# Patient Record
Sex: Female | Born: 1980 | Race: Black or African American | Hispanic: No | Marital: Married | Smoking: Never smoker
Health system: Southern US, Community
[De-identification: ages and names within clinical notes are randomized; demographics above are authoritative.]

## PROBLEM LIST (undated history)

## (undated) DIAGNOSIS — T7840XA Allergy, unspecified, initial encounter: Secondary | ICD-10-CM

## (undated) DIAGNOSIS — F32A Depression, unspecified: Secondary | ICD-10-CM

## (undated) DIAGNOSIS — D649 Anemia, unspecified: Secondary | ICD-10-CM

## (undated) DIAGNOSIS — F419 Anxiety disorder, unspecified: Secondary | ICD-10-CM

## (undated) DIAGNOSIS — K2970 Gastritis, unspecified, without bleeding: Secondary | ICD-10-CM

## (undated) DIAGNOSIS — K219 Gastro-esophageal reflux disease without esophagitis: Secondary | ICD-10-CM

## (undated) DIAGNOSIS — R011 Cardiac murmur, unspecified: Secondary | ICD-10-CM

## (undated) DIAGNOSIS — M779 Enthesopathy, unspecified: Secondary | ICD-10-CM

## (undated) DIAGNOSIS — I471 Supraventricular tachycardia, unspecified: Secondary | ICD-10-CM

## (undated) DIAGNOSIS — K589 Irritable bowel syndrome without diarrhea: Secondary | ICD-10-CM

## (undated) DIAGNOSIS — F329 Major depressive disorder, single episode, unspecified: Secondary | ICD-10-CM

## (undated) HISTORY — PX: CHOLECYSTECTOMY: SHX55

## (undated) HISTORY — PX: OTHER SURGICAL HISTORY: SHX169

## (undated) HISTORY — DX: Cardiac murmur, unspecified: R01.1

## (undated) HISTORY — DX: Allergy, unspecified, initial encounter: T78.40XA

## (undated) HISTORY — PX: BREAST SURGERY: SHX581

## (undated) HISTORY — DX: Gastro-esophageal reflux disease without esophagitis: K21.9

## (undated) HISTORY — PX: TONSILLECTOMY: SUR1361

## (undated) HISTORY — DX: Anemia, unspecified: D64.9

## (undated) HISTORY — PX: TUBAL LIGATION: SHX77

---

## 2017-12-08 ENCOUNTER — Emergency Department
Admission: EM | Admit: 2017-12-08 | Discharge: 2017-12-08 | Disposition: A | Discharge status: Home / Self Care | Payer: Medicaid Other | Attending: Emergency Medicine | Admitting: Emergency Medicine

## 2017-12-08 ENCOUNTER — Other Ambulatory Visit: Payer: Self-pay

## 2017-12-08 ENCOUNTER — Encounter: Payer: Self-pay | Admitting: Emergency Medicine

## 2017-12-08 DIAGNOSIS — R42 Dizziness and giddiness: Secondary | ICD-10-CM

## 2017-12-08 DIAGNOSIS — R112 Nausea with vomiting, unspecified: Secondary | ICD-10-CM

## 2017-12-08 DIAGNOSIS — E860 Dehydration: Secondary | ICD-10-CM

## 2017-12-08 DIAGNOSIS — N3000 Acute cystitis without hematuria: Secondary | ICD-10-CM | POA: Diagnosis not present

## 2017-12-08 HISTORY — DX: Supraventricular tachycardia: I47.1

## 2017-12-08 HISTORY — DX: Anxiety disorder, unspecified: F41.9

## 2017-12-08 HISTORY — DX: Supraventricular tachycardia, unspecified: I47.10

## 2017-12-08 HISTORY — DX: Major depressive disorder, single episode, unspecified: F32.9

## 2017-12-08 HISTORY — DX: Depression, unspecified: F32.A

## 2017-12-08 LAB — COMPREHENSIVE METABOLIC PANEL
ALT: 9 U/L — ABNORMAL LOW (ref 14–54)
ANION GAP: 7 (ref 5–15)
AST: 16 U/L (ref 15–41)
Albumin: 4.2 g/dL (ref 3.5–5.0)
Alkaline Phosphatase: 62 U/L (ref 38–126)
BUN: 10 mg/dL (ref 6–20)
CHLORIDE: 108 mmol/L (ref 101–111)
CO2: 22 mmol/L (ref 22–32)
Calcium: 8.9 mg/dL (ref 8.9–10.3)
Creatinine, Ser: 0.78 mg/dL (ref 0.44–1.00)
GFR calc non Af Amer: >60 mL/min (ref >60)
Glucose, Bld: 85 mg/dL (ref 65–99)
POTASSIUM: 3.6 mmol/L (ref 3.5–5.1)
Sodium: 137 mmol/L (ref 135–145)
TOTAL PROTEIN: 7.4 g/dL (ref 6.5–8.1)
Total Bilirubin: 0.7 mg/dL (ref 0.3–1.2)

## 2017-12-08 LAB — URINALYSIS, COMPLETE (UACMP) WITH MICROSCOPIC
BILIRUBIN URINE: NEGATIVE
Bacteria, UA: NONE SEEN
GLUCOSE, UA: NEGATIVE mg/dL
KETONES UR: 5 mg/dL — AB
NITRITE: NEGATIVE
PH: 8 (ref 5.0–8.0)
Protein, ur: NEGATIVE mg/dL
SPECIFIC GRAVITY, URINE: 1.011 (ref 1.005–1.030)

## 2017-12-08 LAB — CBC WITH DIFFERENTIAL/PLATELET
BASOS ABS: 0 10*3/uL (ref 0–0.1)
Basophils Relative: 0 %
EOS PCT: 0 %
Eosinophils Absolute: 0 10*3/uL (ref 0–0.7)
HCT: 37.9 % (ref 35.0–47.0)
Hemoglobin: 12.8 g/dL (ref 12.0–16.0)
LYMPHS PCT: 12 %
Lymphs Abs: 1.1 10*3/uL (ref 1.0–3.6)
MCH: 29.3 pg (ref 26.0–34.0)
MCHC: 33.8 g/dL (ref 32.0–36.0)
MCV: 86.7 fL (ref 80.0–100.0)
MONO ABS: 0.3 10*3/uL (ref 0.2–0.9)
MONOS PCT: 3 %
Neutro Abs: 8 10*3/uL — ABNORMAL HIGH (ref 1.4–6.5)
Neutrophils Relative %: 85 %
PLATELETS: 358 10*3/uL (ref 150–440)
RBC: 4.38 MIL/uL (ref 3.80–5.20)
RDW: 13.9 % (ref 11.5–14.5)
WBC: 9.4 10*3/uL (ref 3.6–11.0)

## 2017-12-08 LAB — LIPASE, BLOOD: LIPASE: 23 U/L (ref 11–51)

## 2017-12-08 LAB — HCG, QUANTITATIVE, PREGNANCY

## 2017-12-08 MED ORDER — SODIUM CHLORIDE 0.9 % IV BOLUS
1000.0000 mL | Freq: Once | INTRAVENOUS | Status: AC
  Administered 2017-12-08: 1000 mL via INTRAVENOUS

## 2017-12-08 MED ORDER — ONDANSETRON 4 MG PO TBDP: 4.0000 mg | ORAL_TABLET | Freq: Three times a day (TID) | ORAL | 0 refills | Status: DC | PRN

## 2017-12-08 MED ORDER — NITROFURANTOIN MONOHYD MACRO 100 MG PO CAPS
100.0000 mg | ORAL_CAPSULE | Freq: Once | ORAL | Status: AC
  Administered 2017-12-08: 100 mg via ORAL
  Filled 2017-12-08: qty 1

## 2017-12-08 MED ORDER — ONDANSETRON HCL 4 MG/2ML IJ SOLN
4.0000 mg | Freq: Once | INTRAMUSCULAR | Status: AC
  Administered 2017-12-08: 4 mg via INTRAVENOUS
  Filled 2017-12-08: qty 2

## 2017-12-08 MED ORDER — SODIUM CHLORIDE 0.9 % IV BOLUS
1000.0000 mL | Freq: Once | INTRAVENOUS | Status: AC
Start: 2017-12-08 — End: 2017-12-08
  Administered 2017-12-08: 1000 mL via INTRAVENOUS

## 2017-12-08 MED ORDER — MECLIZINE HCL 25 MG PO TABS
25.0000 mg | ORAL_TABLET | Freq: Once | ORAL | Status: AC
  Administered 2017-12-08: 25 mg via ORAL

## 2017-12-08 MED ORDER — MECLIZINE HCL 25 MG PO TABS
ORAL_TABLET | ORAL | Status: AC
  Administered 2017-12-08: 25 mg via ORAL
  Filled 2017-12-08: qty 1

## 2017-12-08 MED ORDER — NITROFURANTOIN MONOHYD MACRO 100 MG PO CAPS: 100.0000 mg | ORAL_CAPSULE | Freq: Two times a day (BID) | ORAL | 0 refills | Status: AC

## 2017-12-08 NOTE — ED Provider Notes (Signed)
[facility] Emergency Department Provider Note  ____________________________________________  Time seen: Approximately 11:57 AM  I have reviewed the triage vital signs and the nursing notes.   HISTORY  Chief Complaint Dizziness and Emesis   HPI Tammy Bennett is a 37 y.o. female a history of anxiety depression who presents for evaluation of dizziness, nausea and vomiting.  Patient reports for the last several weeks she has had severe nausea.  She has had decreased appetite because of the nausea.  She reports that she usually has a small episode of nonbloody nonbilious emesis in the morning and no further episodes during the day but the nausea persists and is usually severe.  This morning she had a small episode of emesis and after that started feeling dizzy like she was going to pass out.  Her hands started to feel tingly.  She reports that the dizziness is improved laying back well when she stands up he gets worse.  She denies diarrhea and has been having normal bowel movements, she denies abdominal pain or surgeries, she denies fever or chills, she denies dysuria or hematuria, she denies URI symptoms, chest pain or shortness of breath.  Patient reports that she has a tubal ligation and an IUD and therefore she does not believe she is pregnant.  She denies history of IBS or IBD or any similar symptoms in the past. No HA  Past Medical History:  Diagnosis Date  . Anxiety   . Depression   . SVT (supraventricular tachycardia) (HCC)     Past Surgical History:  Procedure Laterality Date  . CHOLECYSTECTOMY    . TUBAL LIGATION      Prior to Admission medications   Medication Sig Start Date End Date Taking? Authorizing Provider  nitrofurantoin, macrocrystal-monohydrate, (MACROBID) 100 MG capsule Take 1 capsule (100 mg total) by mouth 2 (two) times daily for 5 days. 12/08/17 12/13/17  Nita Sickle, MD  ondansetron (ZOFRAN ODT) 4 MG disintegrating tablet Take 1  tablet (4 mg total) by mouth every 8 (eight) hours as needed for nausea or vomiting. 12/08/17   Nita Sickle, MD    Allergies Patient has no known allergies.  FH [facility]  Social History Social History   Tobacco Use  . Smoking status: Never Smoker  . Smokeless tobacco: Never Used  Substance Use Topics  . Alcohol use: Not Currently  . Drug use: Not Currently    Review of Systems  Constitutional: Negative for fever. Eyes: Negative for visual changes. ENT: Negative for sore throat. Neck: No neck pain  Cardiovascular: Negative for chest pain. Respiratory: Negative for shortness of breath. Gastrointestinal: Negative for abdominal pain or diarrhea. + Nausea, vomiting Genitourinary: Negative for dysuria. Musculoskeletal: Negative for back pain. Skin: Negative for rash. Neurological: Negative for headaches, weakness or numbness. Psych: No SI or HI  ____________________________________________   PHYSICAL EXAM:  VITAL SIGNS: ED Triage Vitals  Enc Vitals Group     BP 12/08/17 0834 (!) 132/91     Pulse Rate 12/08/17 0834 80     Resp 12/08/17 0834 18     Temp 12/08/17 0834 98.1 F (36.7 C)     Temp Source 12/08/17 0834 Oral     SpO2 12/08/17 0834 100 %     Weight 12/08/17 0839 190 lb (86.2 kg)     Height 12/08/17 0839  (1.575 m)     Head Circumference --      Peak Flow --      Pain Score 12/08/17 0839  0     Pain Loc --      Pain Edu? --      Excl. in GC? --     Constitutional: Alert and oriented. Well appearing and in no apparent distress. HEENT:      Head: Normocephalic and atraumatic.         Eyes: Conjunctivae are normal. Sclera is non-icteric.       Mouth/Throat: Mucous membranes are moist.       Neck: Supple with no signs of meningismus. Cardiovascular: Regular rate and rhythm. No murmurs, gallops, or rubs. 2+ symmetrical distal pulses are present in all extremities. No JVD. Respiratory: Normal respiratory effort. Lungs are clear to auscultation  bilaterally. No wheezes, crackles, or rhonchi.  Gastrointestinal: Soft, non tender, and non distended with positive bowel sounds. No rebound or guarding. Musculoskeletal: Nontender with normal range of motion in all extremities. No edema, cyanosis, or erythema of extremities. Neurologic: Normal speech and language. Face is symmetric. Moving all extremities. No gross focal neurologic deficits are appreciated. Skin: Skin is warm, dry and intact. No rash noted. Psychiatric: Mood and affect are normal. Speech and behavior are normal.  ____________________________________________   LABS (all labs ordered are listed, but only abnormal results are displayed)  Labs Reviewed  CBC WITH DIFFERENTIAL/PLATELET - Abnormal; Notable for the following components:      Result Value   Neutro Abs 8.0 (*)    All other components within normal limits  COMPREHENSIVE METABOLIC PANEL - Abnormal; Notable for the following components:   ALT 9 (*)    All other components within normal limits  URINALYSIS, COMPLETE (UACMP) WITH MICROSCOPIC - Abnormal; Notable for the following components:   Color, Urine YELLOW (*)    APPearance HAZY (*)    Hgb urine dipstick LARGE (*)    Ketones, ur 5 (*)    Leukocytes, UA TRACE (*)    All other components within normal limits  URINE CULTURE  HCG, QUANTITATIVE, PREGNANCY  LIPASE, BLOOD   ____________________________________________  EKG  ED ECG REPORT I, Nita Sickle, the attending physician, personally viewed and interpreted this ECG.  Normal sinus rhythm, rate of 81, normal intervals, right axis deviation, no ST elevations or depressions. ____________________________________________  RADIOLOGY  none ____________________________________________   PROCEDURES  Procedure(s) performed: None Procedures Critical Care performed:  None ____________________________________________   INITIAL IMPRESSION / ASSESSMENT AND PLAN / ED COURSE   37 y.o. female a  history of anxiety depression who presents for evaluation of dizziness, nausea and vomiting x several weeks.  Patient is extremely well-appearing, no distress, has normal vital signs although she is orthostatic with heart rate going from 70s to 90s with standing.  Blood pressure remains stable.  Her abdomen is soft with no tenderness throughout, lungs are clear, physical exam is within normal limits.  Patient has no right upper quadrant tenderness on exam.  hCG is negative.  CBC, CMP and lipase with no acute findings.  Urinalysis is pending to rule out infection or ketones. Patient given zofran and IVF. Will reassess after that.    _________________________ 3:31 PM on 12/08/2017 -----------------------------------------  UA positive for UTI for which patient was started on Macrobid.  After first liter fluid patient still complained of feeling dizzy although improvement on her vital signs.  Patient is currently receiving a second liter of fluids.  Plan to discharge home on Macrobid and follow-up with GI.  Care transferred to Dr. Scotty Court.   As part of my medical decision making, I reviewed the  following data within the electronic MEDICAL RECORD NUMBER Nursing notes reviewed and incorporated, Labs reviewed , Notes from prior ED visits and  Controlled Substance Database    Pertinent labs & imaging results that were available during my care of the patient were reviewed by me and considered in my medical decision making (see chart for details).    ____________________________________________   FINAL CLINICAL IMPRESSION(S) / ED DIAGNOSES  Final diagnoses:  Dehydration  Dizziness  Nausea and vomiting, intractability of vomiting not specified, unspecified vomiting type  Acute cystitis without hematuria      NEW MEDICATIONS STARTED DURING THIS VISIT:  ED Discharge Orders        Ordered    ondansetron (ZOFRAN ODT) 4 MG disintegrating tablet  Every 8 hours PRN     12/08/17 1509     nitrofurantoin, macrocrystal-monohydrate, (MACROBID) 100 MG capsule  2 times daily     12/08/17 1530       Note:  This document was prepared using Dragon voice recognition software and may include unintentional dictation errors.    Nita Sickle, MD 12/08/17 [phone]

## 2017-12-08 NOTE — ED Notes (Addendum)
Pt given fluids and crackers for PO challenge.  While standing pt states she feels lightheaded, dizzy. While standing to taking ortho vitals pt reached out grabbing RNs arm to steady herself while keeping eyes closed

## 2017-12-08 NOTE — ED Triage Notes (Signed)
Pt to ED via POV c/o nausea for the past few weeks. Pt states that every morning when she wakes up she vomits. Pt also states that about 1 hour PTA she started to feel dizzy. Pt states that the dizziness started after she vomited. Pt states that the dizziness has been persistent since then. Pt states that she feels lightheaded and anxious. Pt is in NAD at this time.

## 2017-12-08 NOTE — ED Notes (Signed)
First Nurse Note:  Patient amb to registration desk, complaining of dizziness and tingling starting this AM.  States she has never been seen at this facility before.

## 2017-12-08 NOTE — ED Provider Notes (Signed)
after second liter of IV fluids, patient feels back to normal. She is tolerating oral intake, sitting upright, asymptomatic. Vital signs are normal. She is eager to go home. She is stable for discharge at this time. Macrobid and Zofran as prescribed by Dr. Manuella Ghazi.   Sharman Cheek, MD 12/08/17 1754

## 2017-12-08 NOTE — ED Notes (Addendum)
Pt up to toilet, pt appears to be steady on her feet and is able to ambulate without difficulties.

## 2017-12-09 LAB — URINE CULTURE

## 2018-01-07 ENCOUNTER — Emergency Department: Payer: Medicaid Other

## 2018-01-07 ENCOUNTER — Other Ambulatory Visit: Payer: Self-pay

## 2018-01-07 ENCOUNTER — Emergency Department
Admission: EM | Admit: 2018-01-07 | Discharge: 2018-01-07 | Disposition: A | Discharge status: Home / Self Care | Payer: Medicaid Other | Attending: Emergency Medicine | Admitting: Emergency Medicine

## 2018-01-07 DIAGNOSIS — J069 Acute upper respiratory infection, unspecified: Secondary | ICD-10-CM | POA: Diagnosis not present

## 2018-01-07 DIAGNOSIS — R0602 Shortness of breath: Secondary | ICD-10-CM | POA: Diagnosis present

## 2018-01-07 LAB — COMPREHENSIVE METABOLIC PANEL
ALBUMIN: 4.2 g/dL (ref 3.5–5.0)
ALT: 11 U/L — AB (ref 14–54)
AST: 26 U/L (ref 15–41)
Alkaline Phosphatase: 68 U/L (ref 38–126)
Anion gap: 10 (ref 5–15)
BUN: 11 mg/dL (ref 6–20)
CO2: 19 mmol/L — AB (ref 22–32)
CREATININE: 0.72 mg/dL (ref 0.44–1.00)
Calcium: 8.9 mg/dL (ref 8.9–10.3)
Chloride: 108 mmol/L (ref 101–111)
GFR calc Af Amer: >60 mL/min (ref >60)
GFR calc non Af Amer: >60 mL/min (ref >60)
Glucose, Bld: 98 mg/dL (ref 65–99)
POTASSIUM: 3.1 mmol/L — AB (ref 3.5–5.1)
Sodium: 137 mmol/L (ref 135–145)
Total Bilirubin: 0.5 mg/dL (ref 0.3–1.2)
Total Protein: 7.9 g/dL (ref 6.5–8.1)

## 2018-01-07 LAB — CBC
HCT: 39.4 % (ref 35.0–47.0)
Hemoglobin: 13.6 g/dL (ref 12.0–16.0)
MCH: 29.7 pg (ref 26.0–34.0)
MCHC: 34.5 g/dL (ref 32.0–36.0)
MCV: 86.2 fL (ref 80.0–100.0)
PLATELETS: 364 10*3/uL (ref 150–440)
RBC: 4.57 MIL/uL (ref 3.80–5.20)
RDW: 13.2 % (ref 11.5–14.5)
WBC: 8.4 10*3/uL (ref 3.6–11.0)

## 2018-01-07 LAB — TROPONIN I: Troponin I: <0.03 ng/mL (ref <0.03)

## 2018-01-07 MED ORDER — PULSE OXIMETER MISC: 1.0000 [IU] | Status: DC | PRN

## 2018-01-07 MED ORDER — ALBUTEROL SULFATE (2.5 MG/3ML) 0.083% IN NEBU: 2.5000 mg | INHALATION_SOLUTION | Freq: Four times a day (QID) | RESPIRATORY_TRACT | 0 refills | Status: DC | PRN

## 2018-01-07 MED ORDER — ONDANSETRON HCL 4 MG/2ML IJ SOLN
4.0000 mg | Freq: Once | INTRAMUSCULAR | Status: AC
  Administered 2018-01-07: 4 mg via INTRAVENOUS

## 2018-01-07 MED ORDER — IPRATROPIUM-ALBUTEROL 0.5-2.5 (3) MG/3ML IN SOLN
3.0000 mL | Freq: Once | RESPIRATORY_TRACT | Status: AC
  Administered 2018-01-07: 3 mL via RESPIRATORY_TRACT
  Filled 2018-01-07: qty 3

## 2018-01-07 MED ORDER — ONDANSETRON HCL 4 MG/2ML IJ SOLN
INTRAMUSCULAR | Status: AC
  Administered 2018-01-07: 4 mg via INTRAVENOUS
  Filled 2018-01-07: qty 2

## 2018-01-07 MED ORDER — SODIUM CHLORIDE 0.9 % IV BOLUS
1000.0000 mL | Freq: Once | INTRAVENOUS | Status: AC
  Administered 2018-01-07: 1000 mL via INTRAVENOUS

## 2018-01-07 MED ORDER — ALBUTEROL SULFATE (2.5 MG/3ML) 0.083% IN NEBU
5.0000 mg | INHALATION_SOLUTION | Freq: Once | RESPIRATORY_TRACT | Status: AC
  Administered 2018-01-07: 5 mg via RESPIRATORY_TRACT
  Filled 2018-01-07: qty 6

## 2018-01-07 NOTE — ED Provider Notes (Signed)
[facility] Emergency Department Provider Note  Time seen: 10:29 AM  I have reviewed the triage vital signs and the nursing notes.   HISTORY  Chief Complaint Shortness of Breath and URI    HPI Tammy Bennett is a 37 y.o. female with a past medical history of anxiety, SVT, presents to the emergency department for chest pain.  According to the patient for the past 4 days she has been experiencing cough, congestion chills.  Went to urgent care yesterday was diagnosed with an upper respiratory infection and sent home on antibiotics.  Patient states last night she began experiencing chest pressure as well and felt like she was not improving so today she came to the emergency department for evaluation.  Continues to state mild chest pressure, cough congestion with chills at home.  Denies any nausea vomiting, diarrhea or dysuria.   Past Medical History:  Diagnosis Date  . Anxiety   . Depression   . SVT (supraventricular tachycardia) (HCC)     There are no active problems to display for this patient.   Past Surgical History:  Procedure Laterality Date  . CHOLECYSTECTOMY    . TUBAL LIGATION      Prior to Admission medications   Medication Sig Start Date End Date Taking? Authorizing Provider  ondansetron (ZOFRAN ODT) 4 MG disintegrating tablet Take 1 tablet (4 mg total) by mouth every 8 (eight) hours as needed for nausea or vomiting. 12/08/17   Nita Sickle, MD    No Known Allergies  No family history on file.  Social History Social History   Tobacco Use  . Smoking status: Never Smoker  . Smokeless tobacco: Never Used  Substance Use Topics  . Alcohol use: Not Currently  . Drug use: Not Currently    Review of Systems Constitutional: Subjective fever/chills at home Eyes: Negative for visual complaints ENT: Positive for congestion Cardiovascular: Mild chest pressure Respiratory: Positive for shortness of breath cough Gastrointestinal:  Negative for abdominal pain, vomiting and diarrhea. Genitourinary: Negative for urinary compaints Musculoskeletal: Negative for leg pain or swelling Skin: Negative for skin complaints  Neurological: Negative for headache All other ROS negative  ____________________________________________   PHYSICAL EXAM:  VITAL SIGNS: ED Triage Vitals  Enc Vitals Group     BP 01/07/18 1026 (!) 122/91     Pulse Rate 01/07/18 1026 90     Resp 01/07/18 1026 19     Temp 01/07/18 1026 98.3 F (36.8 C)     Temp Source 01/07/18 1026 Oral     SpO2 01/07/18 1026 99 %     Weight 01/07/18 1027 190 lb (86.2 kg)     Height 01/07/18 1027  (1.575 m)     Head Circumference --      Peak Flow --      Pain Score 01/07/18 1026 3     Pain Loc --      Pain Edu? --      Excl. in GC? --    Constitutional: Alert and oriented. Well appearing and in no distress. Eyes: Normal exam ENT   Head: Normocephalic and atraumatic.   Mouth/Throat: Mucous membranes are moist. Cardiovascular: Normal rate, regular rhythm. No murmur Respiratory: Normal respiratory effort without tachypnea nor retractions. Breath sounds are clear.  No wheeze rales or rhonchi. Gastrointestinal: Soft and nontender. No distention.   Musculoskeletal: Nontender with normal range of motion in all extremities. No lower extremity tenderness or edema. Neurologic:  Normal speech and language. No gross focal neurologic  deficits  Skin:  Skin is warm, dry and intact.  Psychiatric: Mood and affect are normal.   ____________________________________________    EKG  EKG reviewed and interpreted by myself shows sinus rhythm 83 bpm with a narrow QRS, normal axis, largely normal intervals with nonspecific ST changes.  No ST elevation.  ____________________________________________    RADIOLOGY  No active disease on chest x-ray  ____________________________________________   INITIAL IMPRESSION / ASSESSMENT AND PLAN / ED COURSE  Pertinent  labs & imaging results that were available during my care of the patient were reviewed by me and considered in my medical decision making (see chart for details).  Patient presents to the emergency department for cough, congestion, shortness of breath and chest pressure.  Differential would include URI, pneumonia, pneumothorax, ACS.  We will check labs, chest x-ray treat with a DuoNeb and closely monitor.  Patient's work-up is resulted largely within normal limits.  Labs are normal, troponin negative, EKG although slightly nonspecific but there are no concerning changes.  Troponin negative.  Chest x-ray is clear.  We will discharge the patient with supportive care at home.  ____________________________________________   FINAL CLINICAL IMPRESSION(S) / ED DIAGNOSES  Upper respiratory infection    Minna Antis, MD 01/07/18 1255

## 2018-01-07 NOTE — ED Notes (Signed)
Pt became sick on her stomach, MD made aware.

## 2018-01-07 NOTE — ED Triage Notes (Signed)
She arrives today from [facility] with reports of increased shortness of breath and chest tightness that has gotten worse over the last two days  Pt was seen at [facility] two days ago and diagnosed with an URI  She reports that she has been taking the abx as prescribed

## 2018-01-15 ENCOUNTER — Ambulatory Visit: Payer: Self-pay | Admitting: Physician Assistant

## 2018-02-17 ENCOUNTER — Ambulatory Visit: Payer: Self-pay | Admitting: Physician Assistant

## 2018-05-26 ENCOUNTER — Emergency Department: Payer: Medicaid Other

## 2018-05-26 ENCOUNTER — Encounter: Payer: Self-pay | Admitting: Emergency Medicine

## 2018-05-26 ENCOUNTER — Emergency Department
Admission: EM | Admit: 2018-05-26 | Discharge: 2018-05-26 | Disposition: A | Discharge status: Home / Self Care | Payer: Medicaid Other | Attending: Emergency Medicine | Admitting: Emergency Medicine

## 2018-05-26 DIAGNOSIS — F329 Major depressive disorder, single episode, unspecified: Secondary | ICD-10-CM | POA: Insufficient documentation

## 2018-05-26 DIAGNOSIS — F419 Anxiety disorder, unspecified: Secondary | ICD-10-CM | POA: Diagnosis not present

## 2018-05-26 DIAGNOSIS — Z9049 Acquired absence of other specified parts of digestive tract: Secondary | ICD-10-CM | POA: Diagnosis not present

## 2018-05-26 DIAGNOSIS — R1032 Left lower quadrant pain: Secondary | ICD-10-CM | POA: Insufficient documentation

## 2018-05-26 DIAGNOSIS — R1030 Lower abdominal pain, unspecified: Secondary | ICD-10-CM | POA: Diagnosis present

## 2018-05-26 DIAGNOSIS — R109 Unspecified abdominal pain: Secondary | ICD-10-CM

## 2018-05-26 LAB — CBC WITH DIFFERENTIAL/PLATELET
Abs Immature Granulocytes: 0.02 10*3/uL (ref 0.00–0.07)
Basophils Absolute: 0 10*3/uL (ref 0.0–0.1)
Basophils Relative: 0 %
EOS PCT: 0 %
Eosinophils Absolute: 0 10*3/uL (ref 0.0–0.5)
HEMATOCRIT: 38.5 % (ref 36.0–46.0)
Hemoglobin: 13 g/dL (ref 12.0–15.0)
Immature Granulocytes: 0 %
LYMPHS ABS: 1 10*3/uL (ref 0.7–4.0)
LYMPHS PCT: 11 %
MCH: 29.3 pg (ref 26.0–34.0)
MCHC: 33.8 g/dL (ref 30.0–36.0)
MCV: 86.7 fL (ref 80.0–100.0)
MONO ABS: 0.3 10*3/uL (ref 0.1–1.0)
MONOS PCT: 3 %
Neutro Abs: 7.5 10*3/uL (ref 1.7–7.7)
Neutrophils Relative %: 86 %
Platelets: 325 10*3/uL (ref 150–400)
RBC: 4.44 MIL/uL (ref 3.87–5.11)
RDW: 12.3 % (ref 11.5–15.5)
WBC: 8.9 10*3/uL (ref 4.0–10.5)
nRBC: 0 % (ref 0.0–0.2)

## 2018-05-26 LAB — URINALYSIS, COMPLETE (UACMP) WITH MICROSCOPIC
BACTERIA UA: NONE SEEN
BILIRUBIN URINE: NEGATIVE
Glucose, UA: NEGATIVE mg/dL
Hgb urine dipstick: NEGATIVE
Ketones, ur: 20 mg/dL — AB
Leukocytes, UA: NEGATIVE
Nitrite: NEGATIVE
Protein, ur: NEGATIVE mg/dL
SPECIFIC GRAVITY, URINE: 1.02 (ref 1.005–1.030)
pH: 6 (ref 5.0–8.0)

## 2018-05-26 LAB — COMPREHENSIVE METABOLIC PANEL
ALK PHOS: 70 U/L (ref 38–126)
ALT: 10 U/L (ref 0–44)
ANION GAP: 10 (ref 5–15)
AST: 16 U/L (ref 15–41)
Albumin: 4.1 g/dL (ref 3.5–5.0)
BILIRUBIN TOTAL: 0.8 mg/dL (ref 0.3–1.2)
BUN: 12 mg/dL (ref 6–20)
CO2: 20 mmol/L — ABNORMAL LOW (ref 22–32)
CREATININE: 0.91 mg/dL (ref 0.44–1.00)
Calcium: 8.8 mg/dL — ABNORMAL LOW (ref 8.9–10.3)
Chloride: 107 mmol/L (ref 98–111)
GFR calc non Af Amer: >60 mL/min (ref >60)
Glucose, Bld: 86 mg/dL (ref 70–99)
Potassium: 3.4 mmol/L — ABNORMAL LOW (ref 3.5–5.1)
Sodium: 137 mmol/L (ref 135–145)
TOTAL PROTEIN: 7.4 g/dL (ref 6.5–8.1)

## 2018-05-26 LAB — POCT PREGNANCY, URINE: PREG TEST UR: NEGATIVE

## 2018-05-26 LAB — LIPASE, BLOOD: Lipase: 23 U/L (ref 11–51)

## 2018-05-26 MED ORDER — ONDANSETRON 4 MG PO TBDP
4.0000 mg | ORAL_TABLET | Freq: Once | ORAL | Status: AC
  Administered 2018-05-26: 4 mg via ORAL

## 2018-05-26 MED ORDER — CEFTRIAXONE SODIUM 1 G IJ SOLR
1.0000 g | Freq: Once | INTRAMUSCULAR | Status: AC
  Administered 2018-05-26: 1 g via INTRAVENOUS
  Filled 2018-05-26: qty 10

## 2018-05-26 MED ORDER — ONDANSETRON HCL 4 MG/2ML IJ SOLN
4.0000 mg | Freq: Once | INTRAMUSCULAR | Status: AC
  Administered 2018-05-26: 4 mg via INTRAVENOUS
  Filled 2018-05-26: qty 2

## 2018-05-26 MED ORDER — ONDANSETRON 4 MG PO TBDP
ORAL_TABLET | ORAL | Status: AC
  Administered 2018-05-26: 4 mg via ORAL
  Filled 2018-05-26: qty 1

## 2018-05-26 MED ORDER — ONDANSETRON 4 MG PO TBDP: 4.0000 mg | ORAL_TABLET | Freq: Three times a day (TID) | ORAL | 0 refills | Status: DC | PRN

## 2018-05-26 MED ORDER — SODIUM CHLORIDE 0.9 % IV SOLN
1000.0000 mL | Freq: Once | INTRAVENOUS | Status: AC
  Administered 2018-05-26: 1000 mL via INTRAVENOUS

## 2018-05-26 MED ORDER — HYDROMORPHONE HCL 1 MG/ML IJ SOLN
0.5000 mg | Freq: Once | INTRAMUSCULAR | Status: AC
  Administered 2018-05-26: 0.5 mg via INTRAVENOUS
  Filled 2018-05-26: qty 1

## 2018-05-26 MED ORDER — OXYCODONE-ACETAMINOPHEN 5-325 MG PO TABS: 1.0000 | ORAL_TABLET | Freq: Three times a day (TID) | ORAL | 0 refills | Status: DC | PRN

## 2018-05-26 NOTE — ED Triage Notes (Signed)
Pt reports was seen and treated for a UTI, has been taking bactrim for 3 days with no improvement. Pt c/o pain to left flank, mid back and NV.

## 2018-05-26 NOTE — ED Notes (Signed)
PT requesting nausea and pain medications before discharge and prescriptions. Pt is driving self. No pain medication given.

## 2018-05-26 NOTE — ED Provider Notes (Signed)
[facility] Emergency Department Provider Note       Time seen: ----------------------------------------- 8:17 AM on 05/26/2018 -----------------------------------------   I have reviewed the triage vital signs and the nursing notes.  HISTORY   Chief Complaint Flank Pain; Nausea; and Back Pain    HPI Tammy Bennett is a 37 y.o. female with a history of anxiety, depression, SVT who presents to the ED for flank pain with mid back pain as well as nausea and vomiting.  Patient has been taking Bactrim for 3 days after recently being seen and diagnosed with a UTI.  She has not had any improvement.  She has had pyelonephritis in the past she states, no history of renal colic.  Past Medical History:  Diagnosis Date  . Anxiety   . Depression   . SVT (supraventricular tachycardia) (HCC)     There are no active problems to display for this patient.   Past Surgical History:  Procedure Laterality Date  . CHOLECYSTECTOMY    . TUBAL LIGATION      Allergies Patient has no known allergies.  Social History Social History   Tobacco Use  . Smoking status: Never Smoker  . Smokeless tobacco: Never Used  Substance Use Topics  . Alcohol use: Not Currently  . Drug use: Not Currently   Review of Systems Constitutional: Negative for fever. Cardiovascular: Negative for chest pain. Respiratory: Negative for shortness of breath. Gastrointestinal: Positive for flank pain Genitourinary: Negative for vaginal bleeding or discharge Musculoskeletal: Positive for back pain Skin: Negative for rash. Neurological: Negative for headaches, focal weakness or numbness.  All systems negative/normal/unremarkable except as stated in the HPI  ____________________________________________   PHYSICAL EXAM:  VITAL SIGNS: ED Triage Vitals [05/26/18 0815]  Enc Vitals Group     BP 129/77     Pulse Rate 91     Resp 20     Temp 98.5 F (36.9 C)     Temp Source Oral   SpO2 100 %     Weight 175 lb (79.4 kg)     Height 5\' 2"  (1.575 m)     Head Circumference      Peak Flow      Pain Score 4     Pain Loc      Pain Edu?      Excl. in GC?    Constitutional: Alert and oriented.  Mild distress Eyes: Conjunctivae are normal. Normal extraocular movements. ENT   Head: Normocephalic and atraumatic.   Nose: No congestion/rhinnorhea.   Mouth/Throat: Mucous membranes are moist.   Neck: No stridor. Cardiovascular: Normal rate, regular rhythm. No murmurs, rubs, or gallops. Respiratory: Normal respiratory effort without tachypnea nor retractions. Breath sounds are clear and equal bilaterally. No wheezes/rales/rhonchi. Gastrointestinal: Left flank tenderness, no rebound or guarding.  Normal bowel sounds. Musculoskeletal: Nontender with normal range of motion in extremities. No lower extremity tenderness nor edema. Neurologic:  Normal speech and language. No gross focal neurologic deficits are appreciated.  Skin:  Skin is warm, dry and intact. No rash noted. Psychiatric: Mood and affect are normal. Speech and behavior are normal.  ____________________________________________  ED COURSE:  As part of my medical decision making, I reviewed the following data within the electronic MEDICAL RECORD NUMBER History obtained from family if available, nursing notes, old chart and ekg, as well as notes from prior ED visits. Patient presented for flank pain with concerns for pyelonephritis, we will assess with labs and imaging as indicated at this time.   Procedures  ____________________________________________   LABS (pertinent positives/negatives)  Labs Reviewed  COMPREHENSIVE METABOLIC PANEL - Abnormal; Notable for the following components:      Result Value   Potassium 3.4 (*)    CO2 20 (*)    Calcium 8.8 (*)    All other components within normal limits  URINALYSIS, COMPLETE (UACMP) WITH MICROSCOPIC - Abnormal; Notable for the following components:   Color,  Urine YELLOW (*)    APPearance CLEAR (*)    Ketones, ur 20 (*)    All other components within normal limits  CHLAMYDIA/NGC RT PCR ([facility] ONLY)  CBC WITH DIFFERENTIAL/PLATELET  LIPASE, BLOOD  POCT PREGNANCY, URINE    RADIOLOGY Images were viewed by me  Renal ultrasound IMPRESSION: No acute abnormality.  Negative for hydronephrosis.  Simple cystic lesion in the pelvis cannot be definitively characterized but could be a paraovarian or ovarian cyst. No follow-up imaging is recommended. CT renal protocol IMPRESSION: No urolithiasis, urinary obstruction, other acute abnormality identified in the abdomen or pelvis. ____________________________________________  DIFFERENTIAL DIAGNOSIS   Renal colic, UTI, pyelonephritis, muscle strain, constipation  FINAL ASSESSMENT AND PLAN  Flank pain   Plan: The patient had presented for flank pain and vomiting after recently being treated for UTI. Patient's labs did reveal some hematuria but no other acute abnormality. Patient's imaging was negative with the exception of what appeared to be an ovarian cyst seen on CT.  She is not tender near or around her pelvic area or her left adnexa.  We did give her 1 dose of Rocephin here, she is cleared for outpatient follow-up.   Ulice Dash, MD   Note: This note was generated in part or whole with voice recognition software. Voice recognition is usually quite accurate but there are transcription errors that can and very often do occur. I apologize for any typographical errors that were not detected and corrected.     Emily Filbert, MD 05/26/18 [phone]

## 2018-10-07 ENCOUNTER — Emergency Department
Admission: EM | Admit: 2018-10-07 | Discharge: 2018-10-07 | Disposition: A | Discharge status: Home / Self Care | Payer: PRIVATE HEALTH INSURANCE | Attending: Emergency Medicine | Admitting: Emergency Medicine

## 2018-10-07 ENCOUNTER — Encounter: Payer: Self-pay | Admitting: Emergency Medicine

## 2018-10-07 ENCOUNTER — Other Ambulatory Visit: Payer: Self-pay

## 2018-10-07 DIAGNOSIS — F419 Anxiety disorder, unspecified: Secondary | ICD-10-CM | POA: Diagnosis not present

## 2018-10-07 DIAGNOSIS — Z87891 Personal history of nicotine dependence: Secondary | ICD-10-CM | POA: Diagnosis not present

## 2018-10-07 HISTORY — DX: Gastritis, unspecified, without bleeding: K29.70

## 2018-10-07 MED ORDER — CITALOPRAM HYDROBROMIDE 10 MG PO TABS: 10.0000 mg | ORAL_TABLET | Freq: Every day | ORAL | 0 refills | Status: DC

## 2018-10-07 MED ORDER — HYDROXYZINE HCL 25 MG PO TABS: 25.0000 mg | ORAL_TABLET | Freq: Three times a day (TID) | ORAL | 0 refills | Status: DC | PRN

## 2018-10-07 NOTE — ED Notes (Signed)
Patient discharged home with husband, patient received discharge papers and prescription for Celexa and Vistaril. Patient received belongings and verbalized she has received all of his belongings. Patient appropriate and cooperative, Denies SI/HI AVH. Vital signs taken. NAD noted.

## 2018-10-07 NOTE — ED Notes (Signed)
Gave patient ginger ale at this time.

## 2018-10-07 NOTE — ED Notes (Addendum)
Patient signed discharge paperwork and it was placed in medical records box

## 2018-10-07 NOTE — ED Provider Notes (Signed)
[facility] Emergency Department Provider Note  ____________________________________________  Time seen: Approximately 11:01 AM  I have reviewed the triage vital signs and the nursing notes.   HISTORY  Chief Complaint Anxiety    HPI Tammy Bennett is a 38 y.o. female with a history of anxiety and gastritis who complains of worsening anxiety since this morning.  She complains of a constellation of symptoms related to it that are all typical of her anxiety attacks in the past including nausea and loose bowel movements which she relates to IBS and headaches.  She denies fevers or chills, no vomiting or significant pain complaints.  She reports that she has lived in [place] her whole life and just moved down to this area recently and has not had time to get a doctor this area and request medication from the ED for her anxiety.  She states that she used to be on lorazepam and citalopram.  She further clarifies that she moved from [place] 1 year ago.  Review of electronic medical record shows ED visits here in 2017 and 2018.  Denies SI HI or hallucinations.  Feels stressed but not overwhelmed or hopeless or guilty.  Denies loss of sleep or appetite.      Past Medical History:  Diagnosis Date  . Anxiety   . Gastritis      There are no active problems to display for this patient.    Past Surgical History:  Procedure Laterality Date  . CHOLECYSTECTOMY    . TONSILLECTOMY    . TUBAL LIGATION       Prior to Admission medications   Medication Sig Start Date End Date Taking? Authorizing Provider  citalopram (CELEXA) 10 MG tablet Take 1 tablet (10 mg total) by mouth daily for 30 days. 10/07/18 11/06/18  Sharman Cheek, MD  hydrOXYzine (ATARAX/VISTARIL) 25 MG tablet Take 1 tablet (25 mg total) by mouth 3 (three) times daily as needed for anxiety. 10/07/18   Sharman Cheek, MD     Allergies Patient has no allergy information on record.   No family  history on file.  Social History Social History   Tobacco Use  . Smoking status: Former Games developer  . Smokeless tobacco: Never Used  Substance Use Topics  . Alcohol use: Not Currently  . Drug use: Not on file    Review of Systems  Constitutional:   No fever or chills.  ENT:   No sore throat. No rhinorrhea. Cardiovascular:   No chest pain or syncope. Respiratory:   No dyspnea or cough. Gastrointestinal:   Negative for abdominal pain, vomiting and diarrhea.  Musculoskeletal:   Negative for focal pain or swelling All other systems reviewed and are negative except as documented above in ROS and HPI.  ____________________________________________   PHYSICAL EXAM:  VITAL SIGNS: ED Triage Vitals  Enc Vitals Group     BP 10/07/18 0759 127/82     Pulse Rate 10/07/18 0759 90     Resp 10/07/18 0759 18     Temp 10/07/18 0759 98 F (36.7 C)     Temp Source 10/07/18 0759 Oral     SpO2 10/07/18 0759 100 %     Weight 10/07/18 0800 190 lb (86.2 kg)     Height 10/07/18 0800 5\' 2"  (1.575 m)     Head Circumference --      Peak Flow --      Pain Score 10/07/18 0800 0     Pain Loc --      Pain  Edu? --      Excl. in GC? --     Vital signs reviewed, nursing assessments reviewed.   Constitutional:   Alert and oriented. Non-toxic appearance. Eyes:   Conjunctivae are normal. EOMI.  ENT      Head:   Normocephalic and atraumatic.      Nose:   No congestion/rhinnorhea.       Mouth/Throat:   MMM      Neck:   No meningismus. Full ROM. Hematological/Lymphatic/Immunilogical:   No cervical lymphadenopathy. Cardiovascular:   RRR. Symmetric bilateral radial and DP pulses.  No murmurs. Cap refill less than 2 seconds. Respiratory:   Normal respiratory effort without tachypnea/retractions. Breath sounds are clear and equal bilaterally. No wheezes/rales/rhonchi. Gastrointestinal:   Soft and nontender. Non distended. There is no CVA tenderness.  No rebound, rigidity, or guarding. Musculoskeletal:    Normal range of motion in all extremities. No joint effusions.  No lower extremity tenderness.  No edema. Neurologic:   Normal speech and language.  Motor grossly intact. No acute focal neurologic deficits are appreciated.  Skin:    Skin is warm, dry and intact. No rash noted.  No petechiae, purpura, or bullae.  ____________________________________________    LABS (pertinent positives/negatives) (all labs ordered are listed, but only abnormal results are displayed) Labs Reviewed - No data to display ____________________________________________   EKG    ____________________________________________    RADIOLOGY  No results found.  ____________________________________________   PROCEDURES Procedures  ____________________________________________    CLINICAL IMPRESSION / ASSESSMENT AND PLAN / ED COURSE  Medications ordered in the ED: Medications - No data to display  Pertinent labs & imaging results that were available during my care of the patient were reviewed by me and considered in my medical decision making (see chart for details).    Patient presents with anxiety symptoms.  Psychiatrically stable without evidence of psychosis or danger to herself or others.  Medically stable with normal vitals and benign exam.  Advised her that starting her on benzodiazepines would not be an appropriate choice for her anxiety symptoms at this time and she should plan to follow-up with primary care and/or psychiatry for further assessment.  I think it reasonable to restart her on a low-dose of citalopram as well as hydroxyzine as needed, which I will prescribe for her.  Counseled to return to ED for any worsening symptoms including new suicidal thoughts or racing or disorganized thinking.     ____________________________________________   FINAL CLINICAL IMPRESSION(S) / ED DIAGNOSES    Final diagnoses:  Anxiety     ED Discharge Orders         Ordered    citalopram  (CELEXA) 10 MG tablet  Daily     10/07/18 1101    hydrOXYzine (ATARAX/VISTARIL) 25 MG tablet  3 times daily PRN     10/07/18 1101          Portions of this note were generated with dragon dictation software. Dictation errors may occur despite best attempts at proofreading.   Sharman Cheek, MD 10/07/18 1105

## 2018-10-07 NOTE — ED Triage Notes (Signed)
Pt arrives with complaints of being awoken from sleep due to feeling anxious. Pt states she "used to be treated for anxiety but things got better." Pt states she is new to the area and has not been able to find a new doctor yet. PT denies SI/HI

## 2018-10-08 ENCOUNTER — Encounter: Payer: Self-pay | Admitting: Emergency Medicine

## 2018-10-13 ENCOUNTER — Other Ambulatory Visit: Payer: Self-pay

## 2018-10-13 ENCOUNTER — Encounter: Payer: Self-pay | Admitting: Nurse Practitioner

## 2018-10-13 ENCOUNTER — Ambulatory Visit (INDEPENDENT_AMBULATORY_CARE_PROVIDER_SITE_OTHER): Payer: No Typology Code available for payment source | Admitting: Nurse Practitioner

## 2018-10-13 VITALS — BP 128/71 | HR 98 | Temp 98.6°F | Ht 62.0 in | Wt 159.2 lb

## 2018-10-13 DIAGNOSIS — F5104 Psychophysiologic insomnia: Secondary | ICD-10-CM | POA: Diagnosis not present

## 2018-10-13 DIAGNOSIS — J301 Allergic rhinitis due to pollen: Secondary | ICD-10-CM | POA: Diagnosis not present

## 2018-10-13 DIAGNOSIS — R630 Anorexia: Secondary | ICD-10-CM

## 2018-10-13 DIAGNOSIS — R251 Tremor, unspecified: Secondary | ICD-10-CM | POA: Diagnosis not present

## 2018-10-13 DIAGNOSIS — R5383 Other fatigue: Secondary | ICD-10-CM

## 2018-10-13 DIAGNOSIS — R87821 Vaginal low risk human papillomavirus (HPV) DNA test positive: Secondary | ICD-10-CM

## 2018-10-13 DIAGNOSIS — F410 Panic disorder [episodic paroxysmal anxiety] without agoraphobia: Secondary | ICD-10-CM

## 2018-10-13 MED ORDER — MIRTAZAPINE 7.5 MG PO TABS: 7.5000 mg | ORAL_TABLET | Freq: Every day | ORAL | 2 refills | Status: DC

## 2018-10-13 MED ORDER — CETIRIZINE HCL 10 MG PO TABS: 10.0000 mg | ORAL_TABLET | Freq: Every day | ORAL | Status: DC

## 2018-10-13 MED ORDER — FLUTICASONE PROPIONATE 50 MCG/ACT NA SUSP: 2.0000 | Freq: Every day | NASAL | 6 refills | Status: DC

## 2018-10-13 NOTE — Progress Notes (Signed)
Subjective:    Patient ID: Tammy Bennett, female    DOB: [birth year], 38 y.o.   MRN: [id number]  Tammy Bennett is a 38 y.o. female presenting on 10/13/2018 for Establish Care (anxiety issue, pt recently relocated from [place]. Pt having anxiety attacks that wakes her up, vomiting, jittery, and lightheadedness )   HPI Establish Care New Provider Pt last seen by PCP about 2 years ago.  Obtain records from [facility] system in [place], [place].  She has been here for 1.5 years.  Has not had local care.   ER visit for panic attack 10/07/2018 Citalopram started in [place] for about 6 months.  Patient had been off of this for last 1.5 years. Started Vistaril from ER visit last week. - Vistaril has helped a little, but causes severe drowsiness. - Feels she cannot take during day. - Patient would possibly take tid as instructed, but couldn't take due to drowsiness. - For past couple of weeks wakes up very anxious, nervous, nausea with vomiting.  Patient has clear congestion every morning.  Patient was having this in [place] as well.  - diarrhea mucousy  Anorexia - due to decreased appetite Patient has lost about 20 lbs or more.  Hasn't eaten much in last 2 months.  Patient states she has a desire to eat and is "not anorexic."  She doesn't eat because she has no appetite.  GAD 7 : Generalized Anxiety Score 10/13/2018  Nervous, Anxious, on Edge 3  Control/stop worrying 3  Worry too much - different things 3  Trouble relaxing 3  Restless 3  Easily annoyed or irritable 3  Afraid - awful might happen 3  Total GAD 7 Score 21  Anxiety Difficulty Very difficult     Depression screen PHQ 2/9 10/13/2018  Decreased Interest 1  Down, Depressed, Hopeless 1  PHQ - 2 Score 2  Altered sleeping 3  Tired, decreased energy 3  Change in appetite 3  Feeling bad or failure about yourself  2  Trouble concentrating 3  Moving slowly or fidgety/restless 0  Suicidal thoughts 0  PHQ-9 Score 16  Difficult  doing work/chores Very difficult    Past Medical History:  Diagnosis Date  . Allergy   . Anemia   . Anxiety   . Depression   . Gastritis   . GERD (gastroesophageal reflux disease)   . SVT (supraventricular tachycardia) (HCC)    Past Surgical History:  Procedure Laterality Date  . catheter abaltion to heart    . CHOLECYSTECTOMY    . TONSILLECTOMY    . TUBAL LIGATION     Social History   Socioeconomic History  . Marital status: Married    Spouse name: Not on file  . Number of children: Not on file  . Years of education: Not on file  . Highest education level: Not on file  Occupational History  . Not on file  Social Needs  . Financial resource strain: Not on file  . Food insecurity:    Worry: Not on file    Inability: Not on file  . Transportation needs:    Medical: Not on file    Non-medical: Not on file  Tobacco Use  . Smoking status: Former Games developer  . Smokeless tobacco: Never Used  Substance and Sexual Activity  . Alcohol use: Not Currently  . Drug use: Not Currently  . Sexual activity: Not on file  Lifestyle  . Physical activity:    Days per week: Not on file  Minutes per session: Not on file  . Stress: Not on file  Relationships  . Social connections:    Talks on phone: Not on file    Gets together: Not on file    Attends religious service: Not on file    Active member of club or organization: Not on file    Attends meetings of clubs or organizations: Not on file    Relationship status: Not on file  . Intimate partner violence:    Fear of current or ex partner: Not on file    Emotionally abused: Not on file    Physically abused: Not on file    Forced sexual activity: Not on file  Other Topics Concern  . Not on file  Social History Narrative   ** Merged History Encounter **       Family History  Problem Relation Age of Onset  . Stroke Mother   . Stomach cancer Maternal Grandmother   . Leukemia Maternal Grandfather   . Pancreatic cancer  Paternal Grandmother    Current Outpatient Medications on File Prior to Visit  Medication Sig  . citalopram (CELEXA) 10 MG tablet Take 1 tablet (10 mg total) by mouth daily for 30 days.  . hydrOXYzine (ATARAX/VISTARIL) 25 MG tablet Take 1 tablet (25 mg total) by mouth 3 (three) times daily as needed for anxiety.  Marland Kitchen levonorgestrel (MIRENA, 52 MG,) 20 MCG/24HR IUD 1 each by Intrauterine route once.   No current facility-administered medications on file prior to visit.     Review of Systems  Constitutional: Negative for activity change, appetite change and fatigue.  HENT: Positive for congestion. Negative for dental problem.   Eyes: Negative for visual disturbance.  Respiratory: Negative for cough and shortness of breath.   Cardiovascular: Negative for chest pain, palpitations and leg swelling.  Gastrointestinal: Negative for constipation, diarrhea, nausea and vomiting.  Endocrine: Negative for cold intolerance and heat intolerance.  Genitourinary: Negative for dysuria, frequency and urgency.  Musculoskeletal: Negative for arthralgias and myalgias.  Skin: Negative for rash.  Allergic/Immunologic: Positive for environmental allergies.  Neurological: Negative for dizziness and headaches.  Psychiatric/Behavioral: Positive for sleep disturbance. Negative for dysphoric mood, self-injury and suicidal ideas. The patient is nervous/anxious.    Per HPI unless specifically indicated above     Objective:    BP 128/71 (BP Location: Right Arm, Patient Position: Sitting, Cuff Size: Normal)   Pulse 98   Temp 98.6 F (37 C) (Oral)   Ht 5\' 2"  (1.575 m)   Wt 159 lb 3.2 oz (72.2 kg)   BMI 29.12 kg/m   Wt Readings from Last 3 Encounters:  10/13/18 159 lb 3.2 oz (72.2 kg)  10/07/18 190 lb (86.2 kg)  05/26/18 175 lb (79.4 kg)    Physical Exam Vitals signs reviewed.  Constitutional:      General: She is not in acute distress.    Appearance: She is well-developed.  HENT:     Head:  Normocephalic and atraumatic.  Cardiovascular:     Rate and Rhythm: Normal rate and regular rhythm.     Pulses:          Radial pulses are 2+ on the right side and 2+ on the left side.       Posterior tibial pulses are 1+ on the right side and 1+ on the left side.     Heart sounds: Normal heart sounds, S1 normal and S2 normal.  Pulmonary:     Effort: Pulmonary effort is normal. No  respiratory distress.     Breath sounds: Normal breath sounds and air entry.  Musculoskeletal:     Right lower leg: No edema.     Left lower leg: No edema.  Skin:    General: Skin is warm and dry.     Capillary Refill: Capillary refill takes less than 2 seconds.  Neurological:     Mental Status: She is alert and oriented to person, place, and time.  Psychiatric:        Attention and Perception: Attention normal.        Mood and Affect: Affect normal. Mood is anxious.        Behavior: Behavior is withdrawn. Behavior is cooperative.        Thought Content: Thought content normal.        Judgment: Judgment normal.    Results for orders placed or performed in visit on 10/13/18  TSH + free T4  Result Value Ref Range   TSH W/REFLEX TO FT4 0.53 mIU/L  CBC with Differential/Platelet  Result Value Ref Range   WBC 11.3 (H) 3.8 - 10.8 Thousand/uL   RBC 4.62 3.80 - 5.10 Million/uL   Hemoglobin 13.6 11.7 - 15.5 g/dL   HCT 10.2 72.5 - 36.6 %   MCV 87.9 80.0 - 100.0 fL   MCH 29.4 27.0 - 33.0 pg   MCHC 33.5 32.0 - 36.0 g/dL   RDW 44.0 34.7 - 42.5 %   Platelets 402 (H) 140 - 400 Thousand/uL   MPV 9.6 7.5 - 12.5 fL   Neutro Abs 9,662 (H) 1,500 - 7,800 cells/uL   Lymphs Abs 1,277 850 - 3,900 cells/uL   Absolute Monocytes 328 200 - 950 cells/uL   Eosinophils Absolute 0 (L) 15 - 500 cells/uL   Basophils Absolute 34 0 - 200 cells/uL   Neutrophils Relative % 85.5 %   Total Lymphocyte 11.3 %   Monocytes Relative 2.9 %   Eosinophils Relative 0.0 %   Basophils Relative 0.3 %  COMPLETE METABOLIC PANEL WITH GFR    Result Value Ref Range   Glucose, Bld 74 65 - 139 mg/dL   BUN 10 7 - 25 mg/dL   Creat 9.56 3.87 - 5.64 mg/dL   GFR, Est Non African American 113 > OR = 60 mL/min/1.18m2   GFR, Est African American 131 > OR = 60 mL/min/1.64m2   BUN/Creatinine Ratio NOT APPLICABLE 6 - 22 (calc)   Sodium 141 135 - 146 mmol/L   Potassium 3.7 3.5 - 5.3 mmol/L   Chloride 106 98 - 110 mmol/L   CO2 23 20 - 32 mmol/L   Calcium 9.7 8.6 - 10.2 mg/dL   Total Protein 7.6 6.1 - 8.1 g/dL   Albumin 4.8 3.6 - 5.1 g/dL   Globulin 2.8 1.9 - 3.7 g/dL (calc)   AG Ratio 1.7 1.0 - 2.5 (calc)   Total Bilirubin 0.5 0.2 - 1.2 mg/dL   Alkaline phosphatase (APISO) 79 31 - 125 U/L   AST 13 10 - 30 U/L   ALT 6 6 - 29 U/L      Assessment & Plan:   Problem List Items Addressed This Visit      Other   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   Severe anxiety with panic   Relevant Medications   mirtazapine (REMERON) 7.5 MG tablet   Other Relevant Orders   TSH + free T4 (Completed)   Ambulatory referral to Social Work    Other Visit Diagnoses  Anorexia    -  Primary   Relevant Medications   fluticasone (FLONASE) 50 MCG/ACT nasal spray   mirtazapine (REMERON) 7.5 MG tablet   Other Relevant Orders   TSH + free T4 (Completed)   CBC with Differential/Platelet (Completed)   COMPLETE METABOLIC PANEL WITH GFR (Completed)   Psychophysiological insomnia       Relevant Medications   mirtazapine (REMERON) 7.5 MG tablet   Other Relevant Orders   TSH + free T4 (Completed)   Ambulatory referral to Social Work   Seasonal allergic rhinitis due to pollen       Relevant Medications   fluticasone (FLONASE) 50 MCG/ACT nasal spray   cetirizine (ZYRTEC) 10 MG tablet   Tremor       Relevant Orders   TSH + free T4 (Completed)   Fatigue, unspecified type       Relevant Orders   TSH + free T4 (Completed)   CBC with Differential/Platelet (Completed)   COMPLETE METABOLIC PANEL WITH GFR (Completed)      # Previous PCP was at  [place], [place].  Records will be requested.  Past medical, family, and surgical history reviewed w/ pt.   # Fatigue, Tremor Unknown cause. Tremor is possibly related to anxiety.   - Check thyroid, CBC< CMP labs - Follow-up after labs  # Anxiety/Anorexia Suspected new dx GAD now with gradual worsening causing more difficulty functioning, previously coped well, now affecting physically with panic attacks and fatigue from poor sleep / worrying, work/financial stress is primary stressor. Suspected insomnia is secondary to anxiety/mood. -GAD7:21, very difficult / PHQ9: 16 - No prior medications until panic attack and ER visit within last 4 weeks.  Plan: 1. Discussion on new diagnosis anxiety, management, complications, likely contributing to insomnia, tremors, anorexia 2. Continue citalopram  daily AM with food, counseling on potential side effects risks, reviewed possible GI intolerance, insomnia (although likely to improve this given anxiety likely source of insomnia), sexual dysfunction, reviewed black box warning inc suicidal (no prior history, unlikely concern) - anticipate 4-6 weeks for notable effect, may need titrate dose to 20 in future - May continue vistaril 1/2 tab prn panic attack 3. Advised recommend therapy / counseling - referral placed to LCSW. 4. For anorexia, start mirtazapine to increase appetite and help with insomnia. 5. Follow-up 4-6 weeks anxiety, med adjust, GAD7/PHQ9  Meds ordered this encounter  Medications  . fluticasone (FLONASE) 50 MCG/ACT nasal spray    Sig: Place 2 sprays into both nostrils daily.    Dispense:  16 g    Refill:  6    Order Specific Question:   Supervising Provider    Answer:   Smitty Cords [[id number]]  . cetirizine (ZYRTEC) 10 MG tablet    Sig: Take 1 tablet (10 mg total) by mouth daily.    Order Specific Question:   Supervising Provider    Answer:   Smitty Cords [[id number]]  . mirtazapine (REMERON) 7.5 MG tablet    Sig: Take 1  tablet (7.5 mg total) by mouth at bedtime.    Dispense:  30 tablet    Refill:  2    Order Specific Question:   Supervising Provider    Answer:   Smitty Cords [[id number]]     Follow up plan: Return in about 6 weeks (around 11/24/2018) for anxiety, depression.  Wilhelmina Mcardle, DNP, AGPCNP-BC Adult Gerontology Primary Care Nurse Practitioner [facility] 10/13/2018, 10:38 AM

## 2018-10-13 NOTE — Patient Instructions (Addendum)
Tammy Bennett,   Thank you for coming in to clinic today.  1. START mirtazapine 7.5 mg once daily  2. Try 1/2 tablet Vistaril  3. Labs today.  4. START small snacks for meals 2 times daily.  Increase to 6 times daily as tolerated.  Then start making 2 snacks into your dinner meal. - May start Pepcid or famotidine twice daily for 14 days and as needed after that.  Please schedule a follow-up appointment with Wilhelmina Mcardle, AGNP. Return in about 6 weeks (around 11/24/2018) for anxiety, depression.  If you have any other questions or concerns, please feel free to call the clinic or send a message through MyChart. You may also schedule an earlier appointment if necessary.  You will receive a survey after today's visit either digitally by e-mail or paper by [employer]. Your experiences and feedback matter to [place].  Please respond so we know how we are doing as we provide care for you.   Wilhelmina Mcardle, DNP, AGNP-BC Adult Gerontology Nurse Practitioner [facility], [facility]

## 2018-10-14 LAB — COMPLETE METABOLIC PANEL WITH GFR
AG Ratio: 1.7 (calc) (ref 1.0–2.5)
ALT: 6 U/L (ref 6–29)
AST: 13 U/L (ref 10–30)
Albumin: 4.8 g/dL (ref 3.6–5.1)
Alkaline phosphatase (APISO): 79 U/L (ref 31–125)
BUN: 10 mg/dL (ref 7–25)
CO2: 23 mmol/L (ref 20–32)
Calcium: 9.7 mg/dL (ref 8.6–10.2)
Chloride: 106 mmol/L (ref 98–110)
Creat: 0.66 mg/dL (ref 0.50–1.10)
GFR, Est African American: 131 mL/min/{1.73_m2} (ref >=60)
GFR, Est Non African American: 113 mL/min/{1.73_m2} (ref >=60)
Globulin: 2.8 g/dL (calc) (ref 1.9–3.7)
Glucose, Bld: 74 mg/dL (ref 65–139)
Potassium: 3.7 mmol/L (ref 3.5–5.3)
Sodium: 141 mmol/L (ref 135–146)
Total Bilirubin: 0.5 mg/dL (ref 0.2–1.2)
Total Protein: 7.6 g/dL (ref 6.1–8.1)

## 2018-10-14 LAB — CBC WITH DIFFERENTIAL/PLATELET
Absolute Monocytes: 328 cells/uL (ref 200–950)
Basophils Absolute: 34 cells/uL (ref 0–200)
Basophils Relative: 0.3 %
Eosinophils Absolute: 0 cells/uL — ABNORMAL LOW (ref 15–500)
Eosinophils Relative: 0 %
HCT: 40.6 % (ref 35.0–45.0)
Hemoglobin: 13.6 g/dL (ref 11.7–15.5)
Lymphs Abs: 1277 cells/uL (ref 850–3900)
MCH: 29.4 pg (ref 27.0–33.0)
MCHC: 33.5 g/dL (ref 32.0–36.0)
MCV: 87.9 fL (ref 80.0–100.0)
MPV: 9.6 fL (ref 7.5–12.5)
Monocytes Relative: 2.9 %
Neutro Abs: 9662 cells/uL — ABNORMAL HIGH (ref 1500–7800)
Neutrophils Relative %: 85.5 %
Platelets: 402 10*3/uL — ABNORMAL HIGH (ref 140–400)
RBC: 4.62 10*6/uL (ref 3.80–5.10)
RDW: 12 % (ref 11.0–15.0)
Total Lymphocyte: 11.3 %
WBC: 11.3 10*3/uL — ABNORMAL HIGH (ref 3.8–10.8)

## 2018-10-14 LAB — TSH+FREE T4: TSH W/REFLEX TO FT4: 0.53 mIU/L

## 2018-10-20 ENCOUNTER — Encounter: Payer: Self-pay | Admitting: Nurse Practitioner

## 2018-11-23 ENCOUNTER — Ambulatory Visit: Payer: No Typology Code available for payment source | Admitting: Psychology

## 2018-11-24 ENCOUNTER — Other Ambulatory Visit: Payer: Self-pay

## 2018-11-24 ENCOUNTER — Ambulatory Visit: Payer: No Typology Code available for payment source | Admitting: Nurse Practitioner

## 2018-12-20 ENCOUNTER — Other Ambulatory Visit: Payer: Self-pay

## 2018-12-20 ENCOUNTER — Ambulatory Visit (INDEPENDENT_AMBULATORY_CARE_PROVIDER_SITE_OTHER): Payer: No Typology Code available for payment source | Admitting: Family Medicine

## 2018-12-20 ENCOUNTER — Encounter: Payer: Self-pay | Admitting: Family Medicine

## 2018-12-20 DIAGNOSIS — R102 Pelvic and perineal pain: Secondary | ICD-10-CM

## 2018-12-20 DIAGNOSIS — N644 Mastodynia: Secondary | ICD-10-CM | POA: Diagnosis not present

## 2018-12-20 DIAGNOSIS — N61 Mastitis without abscess: Secondary | ICD-10-CM

## 2018-12-20 MED ORDER — AMOXICILLIN-POT CLAVULANATE 875-125 MG PO TABS: 1.0000 | ORAL_TABLET | Freq: Two times a day (BID) | ORAL | 0 refills | Status: DC

## 2018-12-20 NOTE — Patient Instructions (Addendum)
AVS info given by phone. No Mychart  Referral to [facility] [address] [place], [place] [address] Hours: Lewayne Bunting Main: [phone]  VS  [facility]   Address: [address] [place], [place] [address], [place], [place] [address] Hours: 8AM-5PM Phone: [phone]

## 2018-12-20 NOTE — Progress Notes (Signed)
Virtual Visit via Telephone The purpose of this virtual visit is to provide medical care while limiting exposure to the novel coronavirus (COVID19) for both patient and office staff.  Consent was obtained for phone visit:  Yes.   Answered questions that patient had about telehealth interaction:  Yes.   I discussed the limitations, risks, security and privacy concerns of performing an evaluation and management service by telephone. I also discussed with the patient that there may be a patient responsible charge related to this service. The patient expressed understanding and agreed to proceed.  Patient Location: Home Provider Location: [facility] (Office)  PCP is Wilhelmina Mcardle, AGPCNP-BC - I am currently covering during her maternity leave.   ---------------------------------------------------------------------- Chief Complaint  Patient presents with  . Breast Pain    couple of days LMP 12/03/18  . pelvic pressure    couple of days     S: Reviewed CMA documentation. I have called patient and gathered additional HPI as follows:  Chronic Breast Pain, recurrent mastitis / cystic History before 1st pregnancy, she had both nipples pierced (10 yr ago), during prior 2nd pregnancy (4 yr ago) she had nipple fullness and swelling and drainage, treated with antibiotics and packed the area as well. Also had episodes recurrent swelling related to menstrual cycle. - Reports that symptoms started again flare up within past 1 week, described R side breast worsening compared to left, both are swollen and inflamed, nipple sore. Describes again some drainage and fullness at nipple. R worse than L. In past treated with antibiotics with good results. She does not have OBGYN or Surgery down here locally in [place]. She moved to area from [place] - Prior mammogram years ago with issue, but no imaging recently - Denies any spreading redness, or other areas of pain or swelling, fever chills sweats,  nausea vomiting  Pelvic Pain / Pressure / IUD Complication Out of state, now moved to [place] - Last pregnancy 2 years ago. She had bilateral tubal ligation, later she was found to have pregnancy that was "aborting itself" after this BTL, required treatment medically at that time, they did ultrasound follow-up as well GYN. S/p IUD placement. - Now she has had increased pelvic pressure and "movement" with shifting within in her pelvis more recently past few days to weeks, with pelvic pressure and pain or discomfort - She does not think she could be pregnant, home pregnancy test were negative - She is asking about further ultrasound or diagnostic, needs local GYN now   Denies any high risk travel to areas of current concern for COVID19. Denies any known or suspected exposure to person with or possibly with COVID19.  Denies any fevers, chills, sweats, body ache, cough, shortness of breath, sinus pain or pressure, headache, abdominal pain, diarrhea  Past Medical History:  Diagnosis Date  . Allergy   . Anemia   . Anxiety   . Depression   . Gastritis   . GERD (gastroesophageal reflux disease)   . SVT (supraventricular tachycardia) (HCC)    Social History   Tobacco Use  . Smoking status: Former Games developer  . Smokeless tobacco: Never Used  Substance Use Topics  . Alcohol use: Yes  . Drug use: Not Currently    Current Outpatient Medications:  .  cetirizine (ZYRTEC) 10 MG tablet, Take 1 tablet (10 mg total) by mouth daily., Disp: , Rfl:  .  fluticasone (FLONASE) 50 MCG/ACT nasal spray, Place 2 sprays into both nostrils daily., Disp: 16 g,  Rfl: 6 .  hydrOXYzine (ATARAX/VISTARIL) 25 MG tablet, Take 1 tablet (25 mg total) by mouth 3 (three) times daily as needed for anxiety., Disp: 30 tablet, Rfl: 0 .  levonorgestrel (MIRENA, 52 MG,) 20 MCG/24HR IUD, 1 each by Intrauterine route once., Disp: , Rfl:  .  mirtazapine (REMERON) 7.5 MG tablet, Take 1 tablet (7.5 mg total) by mouth at bedtime., Disp: 30  tablet, Rfl: 2 .  amoxicillin-clavulanate (AUGMENTIN) 875-125 MG tablet, Take 1 tablet by mouth 2 (two) times daily., Disp: 20 tablet, Rfl: 0 .  citalopram (CELEXA) 10 MG tablet, Take 1 tablet (10 mg total) by mouth daily for 30 days., Disp: 30 tablet, Rfl: 0  Depression screen [facility] 2/9 12/20/2018 10/13/2018  Decreased Interest 0 1  Down, Depressed, Hopeless 0 1  PHQ - 2 Score 0 2  Altered sleeping 1 3  Tired, decreased energy 1 3  Change in appetite 1 3  Feeling bad or failure about yourself  0 2  Trouble concentrating 1 3  Moving slowly or fidgety/restless 0 0  Suicidal thoughts 0 0  PHQ-9 Score 4 16  Difficult doing work/chores Not difficult at all Very difficult    GAD 7 : Generalized Anxiety Score 12/20/2018 10/13/2018  Nervous, Anxious, on Edge 1 3  Control/stop worrying 1 3  Worry too much - different things 1 3  Trouble relaxing 1 3  Restless 0 3  Easily annoyed or irritable 1 3  Afraid - awful might happen 0 3  Total GAD 7 Score 5 21  Anxiety Difficulty Not difficult at all Very difficult    -------------------------------------------------------------------------- O: No physical exam performed due to remote telephone encounter.   Recent Results (from the past 2160 hour(s))  TSH + free T4     Status: None   Collection Time: 10/13/18 11:18 AM  Result Value Ref Range   TSH W/REFLEX TO FT4 0.53 mIU/L    Comment:           Reference Range .           > or = 20 Years  0.40-4.50 .                Pregnancy Ranges           First trimester    0.26-2.66           Second trimester   0.55-2.73           Third trimester    0.43-2.91   CBC with Differential/Platelet     Status: Abnormal   Collection Time: 10/13/18 11:18 AM  Result Value Ref Range   WBC 11.3 (H) 3.8 - 10.8 Thousand/uL   RBC 4.62 3.80 - 5.10 Million/uL   Hemoglobin 13.6 11.7 - 15.5 g/dL   HCT 16.1 09.6 - 04.5 %   MCV 87.9 80.0 - 100.0 fL   MCH 29.4 27.0 - 33.0 pg   MCHC 33.5 32.0 - 36.0 g/dL   RDW 40.9  81.1 - 91.4 %   Platelets 402 (H) 140 - 400 Thousand/uL   MPV 9.6 7.5 - 12.5 fL   Neutro Abs 9,662 (H) 1,500 - 7,800 cells/uL   Lymphs Abs 1,277 850 - 3,900 cells/uL   Absolute Monocytes 328 200 - 950 cells/uL   Eosinophils Absolute 0 (L) 15 - 500 cells/uL   Basophils Absolute 34 0 - 200 cells/uL   Neutrophils Relative % 85.5 %   Total Lymphocyte 11.3 %   Monocytes Relative 2.9 %  Eosinophils Relative 0.0 %   Basophils Relative 0.3 %  COMPLETE METABOLIC PANEL WITH GFR     Status: None   Collection Time: 10/13/18 11:18 AM  Result Value Ref Range   Glucose, Bld 74 65 - 139 mg/dL    Comment: .        Non-fasting reference interval .    BUN 10 7 - 25 mg/dL   Creat 9.60 4.54 - 0.98 mg/dL   GFR, Est Non African American 113 > OR = 60 mL/min/1.78m2   GFR, Est African American 131 > OR = 60 mL/min/1.75m2   BUN/Creatinine Ratio NOT APPLICABLE 6 - 22 (calc)   Sodium 141 135 - 146 mmol/L   Potassium 3.7 3.5 - 5.3 mmol/L   Chloride 106 98 - 110 mmol/L   CO2 23 20 - 32 mmol/L   Calcium 9.7 8.6 - 10.2 mg/dL   Total Protein 7.6 6.1 - 8.1 g/dL   Albumin 4.8 3.6 - 5.1 g/dL   Globulin 2.8 1.9 - 3.7 g/dL (calc)   AG Ratio 1.7 1.0 - 2.5 (calc)   Total Bilirubin 0.5 0.2 - 1.2 mg/dL   Alkaline phosphatase (APISO) 79 31 - 125 U/L   AST 13 10 - 30 U/L   ALT 6 6 - 29 U/L    -------------------------------------------------------------------------- A&P:  Problem List Items Addressed This Visit    None    Visit Diagnoses    Pain of both breasts    -  Primary   Relevant Orders   Ambulatory referral to Obstetrics / Gynecology   Nipple pain       Mastitis, right, acute       Relevant Medications   amoxicillin-clavulanate (AUGMENTIN) 875-125 MG tablet  Clinically by history remotely, and her chronic history, describes mastitis R>L question if this is related to unresolved recurrent cystic issue, then complicated by secondary skin infection, complication with history of pierced nipples  possibly affecting it - No systemic symptoms of infection  Plan - Treat with Augmentin BID x 10 days - Warm compresses / ice packs - Routine home care - Follow-up if not improving, can go to hospital ED or UC - or discuss with GYN after referred      Pelvic pressure in female       Relevant Orders   Ambulatory referral to Obstetrics / Gynecology   Pelvic pain       Relevant Orders   Ambulatory referral to Obstetrics / Gynecology     Referral to local OBGYN - request [facility] for further evaluation and management, complex OBGYN patient with prior pregnancy, out of [address], had bilateral tubal ligation and spontaneous abortion after that time and now IUD in, questioning IUD placement and effectiveness, requesting further pelvic evaluation and imaging. Also has bilateral breast complaints, with cystic mastitis, anticipate she would warrant future mammogram/ultrasound and consider refer to General Surgery if need   NOTE - advised her that if referral to local GYN would take longer than 1-2 weeks to get established, she may contact us at any time to discuss repeat pregnancy test urine or serum if indicated, otherwise she should discuss further with GYN on plan and options available to her, most likely diagnostic ultrasound.  Orders Placed This Encounter  Procedures  . Ambulatory referral to Obstetrics / Gynecology    Referral Priority:   Routine    Referral Type:   Consultation    Referral Reason:   Specialty Services Required    Requested  Specialty:   Obstetrics and Gynecology    Number of Visits Requested:   1     Meds ordered this encounter  Medications  . amoxicillin-clavulanate (AUGMENTIN) 875-125 MG tablet    Sig: Take 1 tablet by mouth 2 (two) times daily.    Dispense:  20 tablet    Refill:  0    Follow-up: - Return in 3 months for PCP evaluation of breast complaint / nipple pain cystic - may warrant referral to Gen Surgery - Advised to return SOONER  if acute complaint not improved or new concerns.  Patient verbalizes understanding with the above medical recommendations including the limitation of remote medical advice.  Specific follow-up and call-back criteria were given for patient to follow-up or seek medical care more urgently if needed.   - Time spent in direct consultation with patient on phone: 15 minutes  Saralyn PilarAlexander Arnice Vanepps, DO [facility] 12/20/2018, 1:46 PM

## 2018-12-31 ENCOUNTER — Other Ambulatory Visit: Payer: Self-pay

## 2018-12-31 ENCOUNTER — Encounter: Payer: Self-pay | Admitting: Obstetrics and Gynecology

## 2018-12-31 ENCOUNTER — Other Ambulatory Visit (HOSPITAL_COMMUNITY)
Admission: RE | Admit: 2018-12-31 | Discharge: 2018-12-31 | Disposition: A | Discharge status: Home / Self Care | Payer: PRIVATE HEALTH INSURANCE | Source: Ambulatory Visit | Attending: Obstetrics and Gynecology | Admitting: Obstetrics and Gynecology

## 2018-12-31 ENCOUNTER — Ambulatory Visit (INDEPENDENT_AMBULATORY_CARE_PROVIDER_SITE_OTHER): Payer: No Typology Code available for payment source | Admitting: Obstetrics and Gynecology

## 2018-12-31 VITALS — BP 118/68 | HR 100 | Ht 62.0 in | Wt 162.0 lb

## 2018-12-31 DIAGNOSIS — F3281 Premenstrual dysphoric disorder: Secondary | ICD-10-CM

## 2018-12-31 DIAGNOSIS — N644 Mastodynia: Secondary | ICD-10-CM

## 2018-12-31 DIAGNOSIS — Z124 Encounter for screening for malignant neoplasm of cervix: Secondary | ICD-10-CM

## 2018-12-31 DIAGNOSIS — Z30431 Encounter for routine checking of intrauterine contraceptive device: Secondary | ICD-10-CM

## 2018-12-31 DIAGNOSIS — N63 Unspecified lump in unspecified breast: Secondary | ICD-10-CM

## 2018-12-31 DIAGNOSIS — N838 Other noninflammatory disorders of ovary, fallopian tube and broad ligament: Secondary | ICD-10-CM

## 2018-12-31 DIAGNOSIS — N6452 Nipple discharge: Secondary | ICD-10-CM

## 2018-12-31 MED ORDER — SERTRALINE HCL 50 MG PO TABS: 50.0000 mg | ORAL_TABLET | Freq: Every day | ORAL | 6 refills | Status: DC

## 2018-12-31 NOTE — Progress Notes (Signed)
Patient ID: Tammy Bennett, female   DOB: 1981-01-09, 38 y.o.   MRN: [id number]  Reason for Consult: Referral (Irregular periods with IUD, build up of fluid under the nipple on the right side )   Referred by Saralyn Pilar *  Subjective:     HPI:  Tammy Bennett is a 38 y.o. female. She reports irregular bleeding with IUD and fluid under her nipple.    Past Medical History:  Diagnosis Date  . Allergy   . Anemia    DURING PREGNANCY  . Anxiety   . Depression   . Gastritis   . GERD (gastroesophageal reflux disease)   . IBS (irritable bowel syndrome)   . Murmur   . SVT (supraventricular tachycardia) (HCC)   . Tendonitis    ARM RIGHT   Family History  Problem Relation Age of Onset  . Stroke Mother   . Stomach cancer Maternal Grandmother   . Leukemia Maternal Grandfather   . Pancreatic cancer Paternal Grandmother   . Healthy Brother   . Sickle cell anemia Half-Sister   . Crohn's disease Half-Sister   . Ulcerative colitis Half-Sister   . Thyroid disease Cousin   . Breast cancer Maternal Aunt 48   Past Surgical History:  Procedure Laterality Date  . catheter abaltion to heart     Treated SVT as teenager  . CHOLECYSTECTOMY    . COLONOSCOPY WITH PROPOFOL N/A 05/30/2019   Procedure: COLONOSCOPY WITH PROPOFOL;  Surgeon: [name], MD;  Location: [facility] ENDOSCOPY;  Service: Gastroenterology;  Laterality: N/A;  . EVACUATION BREAST HEMATOMA Right 01/12/2019   Procedure: RIGHT EXCISION NIPPLE ABSCESS;  Surgeon: Earline Mayotte, MD;  Location: [facility] ORS;  Service: General;  Laterality: Right;  . TONSILLECTOMY    . TUBAL LIGATION      Short Social History:  Social History   Tobacco Use  . Smoking status: Never Smoker  . Smokeless tobacco: Never Used  Substance Use Topics  . Alcohol use: Yes    Comment: OCC    Allergies  Allergen Reactions  . Bacitracin Rash    Current Outpatient Medications  Medication Sig Dispense Refill  . cetirizine (ZYRTEC) 10 MG  tablet Take 1 tablet (10 mg total) by mouth daily. (Patient taking differently: Take 10 mg by mouth every morning.)    . fluticasone (FLONASE) 50 MCG/ACT nasal spray Place 2 sprays into both nostrils daily. 16 g 6  . levonorgestrel (MIRENA) 20 MCG/24HR IUD 1 each by Intrauterine route once.    . doxycycline (VIBRA-TABS) 100 MG tablet Take 1 tablet (100 mg total) by mouth 2 (two) times daily. For 10 days. Take with full glass of water, stay upright 30 min after taking. 20 tablet 0  . HYDROcodone-acetaminophen (NORCO/VICODIN) 5-325 MG tablet Take 1 tablet by mouth every 6 (six) hours as needed for moderate pain. 20 tablet 0  . hydrOXYzine (ATARAX/VISTARIL) 25 MG tablet Take 1 tablet (25 mg total) by mouth 2 (two) times daily as needed for anxiety. May take 1 additional pill per dose if acute panic attack. Max 3 pills in 24 hours 30 tablet 2  . mirtazapine (REMERON) 7.5 MG tablet TAKE 1 TABLET(7.5 MG) BY MOUTH AT BEDTIME 90 tablet 1   No current facility-administered medications for this visit.    REVIEW OF SYSTEMS      Objective:  Objective   Vitals:   12/31/18 1413  BP: 118/68  Pulse: 100  Weight: 162 lb (73.5 kg)  Height: 5\' 2"  (1.575 m)  Body mass index is 29.63 kg/m.  Physical Exam  Assessment/Plan:     IUD in place Pap today Referral to breast surgery for recurrent nipple pain and discharge  Adelene Idlerhristanna Zacharius Funari MD [facility], [facility] 12/02/2020 2:58 PM

## 2019-01-05 LAB — CYTOLOGY - PAP
Diagnosis: NEGATIVE
HPV: NOT DETECTED

## 2019-01-05 NOTE — Progress Notes (Signed)
Negative, Released to mychart 

## 2019-01-06 ENCOUNTER — Other Ambulatory Visit: Payer: Self-pay

## 2019-01-06 ENCOUNTER — Ambulatory Visit (INDEPENDENT_AMBULATORY_CARE_PROVIDER_SITE_OTHER): Payer: No Typology Code available for payment source | Admitting: General Surgery

## 2019-01-06 ENCOUNTER — Encounter: Payer: Self-pay | Admitting: General Surgery

## 2019-01-06 VITALS — BP 123/83 | HR 91 | Temp 97.9°F | Ht 62.0 in | Wt 164.6 lb

## 2019-01-06 DIAGNOSIS — N6001 Solitary cyst of right breast: Secondary | ICD-10-CM | POA: Insufficient documentation

## 2019-01-06 NOTE — Patient Instructions (Addendum)
The patient is aware to call back for any questions or new concerns.   Schedule Excision right nipple cyst at [facility]  Patient's surgery to be scheduled for 01/12/19 at [facility] with Dr. Lemar Livings  The patient is aware to have COVID-19 testing done on 01/07/19 at the [facility] drive thru ([address] [facility]) between 10:30 am and 12:30 pm. she is aware to isolate after, have no visitors, wash hands frequently, and avoid touching face.   The patient is aware she will be contacted by the Pre-Admission Testing Department to complete a phone interview sometime in the near future.  Patient aware to be NPO after midnight and have a driver.   She is aware to check in at the [facility] entrance where she will be screened for the coronavirus and then sent to Same Day Surgery.   Patient aware that she may have no visitors and driver will need to wait in the car due to COVID-19 restrictions.   The patient verbalizes understanding of the above.   The patient is aware to call the office should she have further questions.

## 2019-01-06 NOTE — Progress Notes (Signed)
Patient ID: Tammy Bennett, female   DOB: 1981-07-26, 38 y.o.   MRN: [id number]  Chief Complaint  Patient presents with  . Breast Problem    right nipple cyst    HPI Tammy Bennett is a 38 y.o. female.  Here for right nipple cyst referred by Dr Renaldo Reel at [facility]. She noticed the cyst about 4 years ago while she was pregnant with her daughter. An incision and drainage was done and that helped. She states for the past couple of months it would "flare up" rupture and heal with and without her period cycle. She states it is occurring more often and last week it actually ruptured and she had bloody drainage.  Mammogram was in [place]. 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  HPI  Past Medical History:  Diagnosis Date  . Allergy   . Anemia   . Anxiety   . Depression   . Gastritis   . GERD (gastroesophageal reflux disease)   . Murmur   . SVT (supraventricular tachycardia) (HCC)     Past Surgical History:  Procedure Laterality Date  . catheter abaltion to heart     Treated SVT as teenager  . CHOLECYSTECTOMY    . TONSILLECTOMY    . TUBAL LIGATION      Family History  Problem Relation Age of Onset  . Stroke Mother   . Stomach cancer Maternal Grandmother   . Leukemia Maternal Grandfather   . Pancreatic cancer Paternal Grandmother   . Healthy Brother   . Sickle cell anemia Half-Sister   . Crohn's disease Half-Sister   . Ulcerative colitis Half-Sister   . Thyroid disease Cousin   . Breast cancer Maternal Aunt 1    Social History Social History   Tobacco Use  . Smoking status: Never Smoker  . Smokeless tobacco: Never Used  Substance Use Topics  . Alcohol use: Yes  . Drug use: Not Currently    Types: Marijuana    Comment: none in years    No Known Allergies  Current Outpatient Medications  Medication Sig Dispense Refill  . cetirizine (ZYRTEC) 10 MG tablet Take 1 tablet (10 mg total) by mouth daily.    . citalopram  (CELEXA) 10 MG tablet Take 1 tablet (10 mg total) by mouth daily for 30 days. 30 tablet 0  . fluticasone (FLONASE) 50 MCG/ACT nasal spray Place 2 sprays into both nostrils daily. 16 g 6  . hydrOXYzine (ATARAX/VISTARIL) 25 MG tablet Take 1 tablet (25 mg total) by mouth 3 (three) times daily as needed for anxiety. 30 tablet 0  . levonorgestrel (MIRENA, 52 MG,) 20 MCG/24HR IUD 1 each by Intrauterine Bennett once.    . mirtazapine (REMERON) 7.5 MG tablet Take 1 tablet (7.5 mg total) by mouth at bedtime. 30 tablet 2  . sertraline (ZOLOFT) 50 MG tablet Take 1 tablet (50 mg total) by mouth daily. 30 tablet 6   No current facility-administered medications for this visit.     Review of Systems Review of Systems  Constitutional: Negative.   Respiratory: Negative.   Cardiovascular: Negative.     Blood pressure 123/83, pulse 91, temperature 97.9 F (36.6 C), temperature source Temporal, height  (1.575 m), weight 164 lb 9.6 oz (74.7 kg), last menstrual period 12/29/2018, SpO2 98 %.  Physical Exam Physical Exam Exam conducted with a chaperone present.  Constitutional:      Appearance: She is well-developed.  HENT:     Mouth/Throat:  Pharynx: No oropharyngeal exudate.  Eyes:     General: No scleral icterus.    Conjunctiva/sclera: Conjunctivae normal.  Neck:     Musculoskeletal: Neck supple.  Cardiovascular:     Rate and Rhythm: Normal rate and regular rhythm.     Heart sounds: Murmur present. Systolic murmur present with a grade of 2/6.  Pulmonary:     Effort: Pulmonary effort is normal.     Breath sounds: Normal breath sounds.  Chest:     Breasts:        Right: No inverted nipple, mass, nipple discharge, skin change or tenderness.        Left: No inverted nipple, mass, nipple discharge, skin change or tenderness.       Comments: Right nipple cyst Lymphadenopathy:     Cervical: No cervical adenopathy.     Upper Body:     Right upper body: No supraclavicular or axillary  adenopathy.     Left upper body: No supraclavicular or axillary adenopathy.  Skin:    General: Skin is warm and dry.  Neurological:     Mental Status: She is alert and oriented to person, place, and time.  Psychiatric:        Behavior: Behavior normal.     Data Reviewed Prior mammograms completed in [place] and not available for review.   Assessment Chronic, recurrent nipple/ areolar abscess.  Plan The patient reports that her prior drainage and packing procedure was completed with local anesthetic as an office procedure and is still memorable as incredibly painful.  We discussed the opportunity for anesthesia and this was discussed.  Plan debride this area which may result in some loss of nipple diameter and height was discussed, and this is acceptable to the patient.  Possibility of the nipple oral contact is contributing to the ongoing infections was reviewed, and the patient will take this under advisement.  Likely, the wound will be left at least partially open to allow complete drainage dressing change should be fairly straightforward. Recommend [facility] The patient is aware to call back for any questions or new concerns.  HPI, assessment, plan and physical exam has been scribed under the direction and in the presence of Earline MayotteJeffrey W. Kandance Yano, MD. Dorathy DaftMarsha Hatch, RN  I have completed the exam and reviewed the above documentation for accuracy and completeness.  I agree with the above.  [employer]useum[employer]gallery [employer] has been used and any errors in dictation or transcription are unintentional.  Donnalee CurryJeffrey Jarquis Walker, M.D., F.A.C.S.  Patient[facility] with Dr. Lemar LivingsByrnett  The patient is aware to have COVID-19 testing done on 01/07/19 at the [facility] drive thru ([id number] [facility] [address] [place]) between 10:30 am and 12:30 pm. she is aware to isolate after, have no visitors, wash hands frequently, and avoid touching face.   The  patient is aware she will be contacted by the Pre-Admission Testing Department to complete a phone interview sometime in the near future.  Patient aware to be NPO after midnight and have a driver.   She is aware to check in at the [facility] entrance where she will be screened for the coronavirus and then sent to Same Day Surgery.   Patient aware that she may have no visitors and driver will need to wait in the car due to COVID-19 restrictions.   The patient verbalizes understanding of the above.   The patient is aware to call the office should she  have further questions.   Documented by Caryl-Lyn Louanna Raw LPN   Merrily Pew Jaylissa Felty 01/06/2019, 10:30 AM

## 2019-01-07 ENCOUNTER — Encounter
Admission: RE | Admit: 2019-01-07 | Discharge: 2019-01-07 | Disposition: A | Discharge status: Home / Self Care | Payer: No Typology Code available for payment source | Source: Ambulatory Visit | Attending: General Surgery | Admitting: General Surgery

## 2019-01-07 DIAGNOSIS — I4510 Unspecified right bundle-branch block: Secondary | ICD-10-CM | POA: Insufficient documentation

## 2019-01-07 DIAGNOSIS — Z1159 Encounter for screening for other viral diseases: Secondary | ICD-10-CM | POA: Insufficient documentation

## 2019-01-07 DIAGNOSIS — Z01818 Encounter for other preprocedural examination: Secondary | ICD-10-CM | POA: Diagnosis not present

## 2019-01-07 DIAGNOSIS — Z8679 Personal history of other diseases of the circulatory system: Secondary | ICD-10-CM | POA: Insufficient documentation

## 2019-01-07 DIAGNOSIS — R011 Cardiac murmur, unspecified: Secondary | ICD-10-CM | POA: Diagnosis not present

## 2019-01-07 DIAGNOSIS — R002 Palpitations: Secondary | ICD-10-CM | POA: Diagnosis not present

## 2019-01-07 HISTORY — DX: Irritable bowel syndrome, unspecified: K58.9

## 2019-01-07 HISTORY — DX: Enthesopathy, unspecified: M77.9

## 2019-01-07 NOTE — Patient Instructions (Addendum)
Your procedure is scheduled on: 01-12-19 [facility] Report to Same Day Surgery 2nd floor medical mall [facility](Medical Mall Entrance-take elevator on left to 2nd floor.  Check in with surgery information desk.) To find out your arrival time please call [phone] between 1PM - 3PM on 01-11-19 TUESDAY  Remember: Instructions that are not followed completely may result in serious medical risk, up to and including death, or upon the discretion of your surgeon and anesthesiologist your surgery may need to be rescheduled.    _x___ 1. Do not eat food after midnight the night before your procedure. NO GUM OR CANDY AFTER MIDNIGHT. You may drink clear liquids up to 2 hours before you are scheduled to arrive at the hospital for your procedure.  Do not drink clear liquids within 2 hours of your scheduled arrival to the hospital.  Clear liquids include  --Water or Apple juice without pulp  --Clear carbohydrate beverage such as ClearFast or Gatorade  --Black Coffee or Clear Tea (No milk, no creamers, do not add anything to the coffee or Tea   ____Ensure clear carbohydrate drink on the way to the hospital for bariatric patients  ____Ensure clear carbohydrate drink 3 hours before surgery for Dr Rutherford NailByrnett's patients if physician instructed.    __x__ 2. No Alcohol for 24 hours before or after surgery.   __x__3. No Smoking or e-cigarettes for 24 prior to surgery.  Do not use any chewable tobacco products for at least 6 hour prior to surgery   ____  4. Bring all medications with you on the day of surgery if instructed.    __x__ 5. Notify your doctor if there is any change in your medical condition     (cold, fever, infections).    x___6. On the morning of surgery brush your teeth with toothpaste and water.  You may rinse your mouth with mouth wash if you wish.  Do not swallow any toothpaste or mouthwash.   Do not wear jewelry, make-up, hairpins, clips or nail polish.  Do not wear lotions, powders, or perfumes. You  may wear deodorant.  Do not shave 48 hours prior to surgery. Men may shave face and neck.  Do not bring valuables to the hospital.    [facility] belongings or valuables.               Contacts, dentures or bridgework may not be worn into surgery.  Leave your suitcase in the car. After surgery it may be brought to your room.  For patients admitted to the hospital, discharge time is determined by your treatment team.  _  Patients discharged the day of surgery will not be allowed to drive home.  You will need someone to drive you home and stay with you the night of your procedure.    Please read over the following fact sheets that you were given:   [facility] and or MRSA Information   _x___ TAKE THE FOLLOWING MEDICATION THE MORNING OF SURGERY WITH A SMALL SIP OF WATER. These include:  1. ZYRTEC  2.  3.  4.  5.  6.  ____Fleets enema or Magnesium Citrate as directed.   _x___ Use CHG Soap or sage wipes as directed on instruction sheet   ____ Use inhalers on the day of surgery and bring to hospital day of surgery  ____ Stop Metformin and Janumet 2 days prior to surgery.    ____ Take 1/2 of usual insulin dose the  night before surgery and none on the morning surgery.   ____ Follow recommendations from Cardiologist, Pulmonologist or PCP regarding stopping Aspirin, Coumadin, Plavix ,Eliquis, Effient, or Pradaxa, and Pletal.  ____Stop Anti-inflammatories such as Advil, Aleve, Ibuprofen, Motrin, Naproxen, Naprosyn, Goodies powders or aspirin products. OK to take Tylenol    ____ Stop supplements until after surgery.     ____ Bring C-Pap to the hospital.

## 2019-01-08 LAB — NOVEL CORONAVIRUS, NAA (HOSP ORDER, SEND-OUT TO REF LAB; TAT 18-24 HRS): SARS-CoV-2, NAA: NOT DETECTED

## 2019-01-10 ENCOUNTER — Encounter
Admission: RE | Admit: 2019-01-10 | Discharge: 2019-01-10 | Disposition: A | Discharge status: Home / Self Care | Payer: No Typology Code available for payment source | Source: Ambulatory Visit | Attending: General Surgery | Admitting: General Surgery

## 2019-01-10 ENCOUNTER — Other Ambulatory Visit: Payer: Self-pay

## 2019-01-10 DIAGNOSIS — Z01818 Encounter for other preprocedural examination: Secondary | ICD-10-CM | POA: Diagnosis not present

## 2019-01-11 MED ORDER — CEFAZOLIN SODIUM-DEXTROSE 2-4 GM/100ML-% IV SOLN
2.0000 g | INTRAVENOUS | Status: AC
  Administered 2019-01-12: 2 g via INTRAVENOUS

## 2019-01-12 ENCOUNTER — Encounter
Admission: RE | Disposition: A | Discharge status: Home / Self Care | Payer: Self-pay | Source: Home / Self Care | Attending: General Surgery

## 2019-01-12 ENCOUNTER — Ambulatory Visit
Admission: RE | Admit: 2019-01-12 | Discharge: 2019-01-12 | Disposition: A | Discharge status: Home / Self Care | Payer: PRIVATE HEALTH INSURANCE | Attending: General Surgery | Admitting: General Surgery

## 2019-01-12 ENCOUNTER — Other Ambulatory Visit: Payer: Self-pay

## 2019-01-12 ENCOUNTER — Encounter: Payer: Self-pay | Admitting: *Deleted

## 2019-01-12 ENCOUNTER — Ambulatory Visit: Payer: PRIVATE HEALTH INSURANCE | Admitting: Certified Registered"

## 2019-01-12 DIAGNOSIS — Z79899 Other long term (current) drug therapy: Secondary | ICD-10-CM | POA: Insufficient documentation

## 2019-01-12 DIAGNOSIS — N6001 Solitary cyst of right breast: Secondary | ICD-10-CM

## 2019-01-12 DIAGNOSIS — K219 Gastro-esophageal reflux disease without esophagitis: Secondary | ICD-10-CM | POA: Diagnosis not present

## 2019-01-12 DIAGNOSIS — F329 Major depressive disorder, single episode, unspecified: Secondary | ICD-10-CM | POA: Diagnosis not present

## 2019-01-12 DIAGNOSIS — F419 Anxiety disorder, unspecified: Secondary | ICD-10-CM | POA: Insufficient documentation

## 2019-01-12 DIAGNOSIS — N611 Abscess of the breast and nipple: Secondary | ICD-10-CM | POA: Diagnosis not present

## 2019-01-12 HISTORY — PX: EVACUATION BREAST HEMATOMA: SHX1537

## 2019-01-12 LAB — POCT PREGNANCY, URINE: Preg Test, Ur: NEGATIVE

## 2019-01-12 SURGERY — EVACUATION, HEMATOMA, BREAST
Anesthesia: General | Laterality: Right

## 2019-01-12 MED ORDER — GABAPENTIN 300 MG PO CAPS
300.0000 mg | ORAL_CAPSULE | ORAL | Status: AC
  Administered 2019-01-12: 300 mg via ORAL

## 2019-01-12 MED ORDER — DEXAMETHASONE SODIUM PHOSPHATE 10 MG/ML IJ SOLN
INTRAMUSCULAR | Status: AC
  Filled 2019-01-12: qty 1

## 2019-01-12 MED ORDER — BACITRACIN ZINC 500 UNIT/GM EX OINT
TOPICAL_OINTMENT | CUTANEOUS | Status: AC
  Filled 2019-01-12: qty 28.35

## 2019-01-12 MED ORDER — DEXAMETHASONE SODIUM PHOSPHATE 10 MG/ML IJ SOLN
INTRAMUSCULAR | Status: DC | PRN
  Administered 2019-01-12: 8 mg via INTRAVENOUS

## 2019-01-12 MED ORDER — ROCURONIUM BROMIDE 50 MG/5ML IV SOLN
INTRAVENOUS | Status: AC
  Filled 2019-01-12: qty 1

## 2019-01-12 MED ORDER — FENTANYL CITRATE (PF) 100 MCG/2ML IJ SOLN
INTRAMUSCULAR | Status: DC | PRN
  Administered 2019-01-12 (×2): 50 ug via INTRAVENOUS

## 2019-01-12 MED ORDER — HYDROCODONE-ACETAMINOPHEN 5-325 MG PO TABS: 1.0000 | ORAL_TABLET | ORAL | 0 refills | Status: DC | PRN

## 2019-01-12 MED ORDER — WHITE PETROLATUM EX OINT
TOPICAL_OINTMENT | CUTANEOUS | Status: AC
  Administered 2019-01-12: 15:00:00
  Filled 2019-01-12: qty 5

## 2019-01-12 MED ORDER — LIDOCAINE HCL (PF) 2 % IJ SOLN
INTRAMUSCULAR | Status: AC
  Filled 2019-01-12: qty 10

## 2019-01-12 MED ORDER — ACETAMINOPHEN 10 MG/ML IV SOLN
1000.0000 mg | Freq: Once | INTRAVENOUS | Status: AC
  Administered 2019-01-12: 13:00:00 1000 mg via INTRAVENOUS

## 2019-01-12 MED ORDER — PROMETHAZINE HCL 25 MG/ML IJ SOLN
6.2500 mg | INTRAMUSCULAR | Status: DC | PRN
  Administered 2019-01-12: 15:00:00 6.25 mg via INTRAVENOUS

## 2019-01-12 MED ORDER — DIPHENHYDRAMINE HCL 50 MG/ML IJ SOLN
INTRAMUSCULAR | Status: AC
  Filled 2019-01-12: qty 1

## 2019-01-12 MED ORDER — ONDANSETRON HCL 4 MG/2ML IJ SOLN
INTRAMUSCULAR | Status: DC | PRN
  Administered 2019-01-12: 4 mg via INTRAVENOUS

## 2019-01-12 MED ORDER — HYDROCODONE-ACETAMINOPHEN 5-325 MG PO TABS
ORAL_TABLET | ORAL | Status: AC
  Filled 2019-01-12: qty 1

## 2019-01-12 MED ORDER — SODIUM CHLORIDE FLUSH 0.9 % IV SOLN
INTRAVENOUS | Status: AC
  Filled 2019-01-12: qty 10

## 2019-01-12 MED ORDER — PHENYLEPHRINE HCL (PRESSORS) 10 MG/ML IV SOLN
INTRAVENOUS | Status: AC
  Filled 2019-01-12: qty 1

## 2019-01-12 MED ORDER — SUGAMMADEX SODIUM 200 MG/2ML IV SOLN
INTRAVENOUS | Status: AC
  Filled 2019-01-12: qty 2

## 2019-01-12 MED ORDER — EPHEDRINE SULFATE 50 MG/ML IJ SOLN
INTRAMUSCULAR | Status: AC
  Filled 2019-01-12: qty 1

## 2019-01-12 MED ORDER — FAMOTIDINE 20 MG PO TABS
ORAL_TABLET | ORAL | Status: AC
  Filled 2019-01-12: qty 1

## 2019-01-12 MED ORDER — HYDROMORPHONE HCL 1 MG/ML IJ SOLN
INTRAMUSCULAR | Status: AC
  Administered 2019-01-12: 13:00:00 0.25 mg via INTRAVENOUS
  Filled 2019-01-12: qty 1

## 2019-01-12 MED ORDER — MIDAZOLAM HCL 2 MG/2ML IJ SOLN
INTRAMUSCULAR | Status: DC | PRN
  Administered 2019-01-12: 2 mg via INTRAVENOUS

## 2019-01-12 MED ORDER — GABAPENTIN 300 MG PO CAPS
ORAL_CAPSULE | ORAL | Status: AC
  Filled 2019-01-12: qty 1

## 2019-01-12 MED ORDER — BUPIVACAINE-EPINEPHRINE (PF) 0.5% -1:200000 IJ SOLN
INTRAMUSCULAR | Status: AC
  Filled 2019-01-12: qty 30

## 2019-01-12 MED ORDER — PROMETHAZINE HCL 25 MG/ML IJ SOLN
INTRAMUSCULAR | Status: AC
  Filled 2019-01-12: qty 1

## 2019-01-12 MED ORDER — LACTATED RINGERS IV SOLN
INTRAVENOUS | Status: DC
  Administered 2019-01-12: 11:00:00 via INTRAVENOUS

## 2019-01-12 MED ORDER — ONDANSETRON HCL 4 MG/2ML IJ SOLN
INTRAMUSCULAR | Status: AC
  Filled 2019-01-12: qty 2

## 2019-01-12 MED ORDER — CEFAZOLIN SODIUM-DEXTROSE 2-4 GM/100ML-% IV SOLN
INTRAVENOUS | Status: AC
  Filled 2019-01-12: qty 100

## 2019-01-12 MED ORDER — ACETAMINOPHEN 10 MG/ML IV SOLN
INTRAVENOUS | Status: AC
  Administered 2019-01-12: 13:00:00 1000 mg via INTRAVENOUS
  Filled 2019-01-12: qty 100

## 2019-01-12 MED ORDER — BACITRACIN ZINC 500 UNIT/GM EX OINT
TOPICAL_OINTMENT | CUTANEOUS | Status: DC | PRN
  Administered 2019-01-12: 1 via TOPICAL

## 2019-01-12 MED ORDER — PROPOFOL 10 MG/ML IV BOLUS
INTRAVENOUS | Status: AC
  Filled 2019-01-12: qty 20

## 2019-01-12 MED ORDER — BUPIVACAINE-EPINEPHRINE (PF) 0.5% -1:200000 IJ SOLN
INTRAMUSCULAR | Status: DC | PRN
  Administered 2019-01-12: 30 mL

## 2019-01-12 MED ORDER — HYDROCODONE-ACETAMINOPHEN 5-325 MG PO TABS
1.0000 | ORAL_TABLET | Freq: Once | ORAL | Status: AC
  Administered 2019-01-12: 15:00:00 1 via ORAL

## 2019-01-12 MED ORDER — LIDOCAINE HCL (CARDIAC) PF 100 MG/5ML IV SOSY
PREFILLED_SYRINGE | INTRAVENOUS | Status: DC | PRN
  Administered 2019-01-12: 80 mg via INTRAVENOUS

## 2019-01-12 MED ORDER — FAMOTIDINE 20 MG PO TABS
20.0000 mg | ORAL_TABLET | Freq: Once | ORAL | Status: AC
  Administered 2019-01-12: 20 mg via ORAL

## 2019-01-12 MED ORDER — MIDAZOLAM HCL 2 MG/2ML IJ SOLN
INTRAMUSCULAR | Status: AC
  Filled 2019-01-12: qty 2

## 2019-01-12 MED ORDER — ACETAMINOPHEN 10 MG/ML IV SOLN
INTRAVENOUS | Status: AC
  Filled 2019-01-12: qty 100

## 2019-01-12 MED ORDER — FENTANYL CITRATE (PF) 100 MCG/2ML IJ SOLN
INTRAMUSCULAR | Status: AC
  Filled 2019-01-12: qty 2

## 2019-01-12 MED ORDER — PROPOFOL 10 MG/ML IV BOLUS
INTRAVENOUS | Status: DC | PRN
  Administered 2019-01-12: 50 mg via INTRAVENOUS
  Administered 2019-01-12: 150 mg via INTRAVENOUS

## 2019-01-12 MED ORDER — DIPHENHYDRAMINE HCL 50 MG/ML IJ SOLN
25.0000 mg | Freq: Once | INTRAMUSCULAR | Status: AC
  Administered 2019-01-12: 25 mg via INTRAVENOUS

## 2019-01-12 MED ORDER — HYDROMORPHONE HCL 1 MG/ML IJ SOLN
0.2500 mg | INTRAMUSCULAR | Status: AC | PRN
  Administered 2019-01-12 (×8): 0.25 mg via INTRAVENOUS

## 2019-01-12 MED ORDER — SUCCINYLCHOLINE CHLORIDE 20 MG/ML IJ SOLN
INTRAMUSCULAR | Status: AC
  Filled 2019-01-12: qty 1

## 2019-01-12 SURGICAL SUPPLY — 46 items
BINDER BREAST LRG (GAUZE/BANDAGES/DRESSINGS) IMPLANT
BINDER BREAST MEDIUM (GAUZE/BANDAGES/DRESSINGS) IMPLANT
BINDER BREAST XLRG (GAUZE/BANDAGES/DRESSINGS) IMPLANT
BINDER BREAST XXLRG (GAUZE/BANDAGES/DRESSINGS) IMPLANT
BLADE SURG 15 STRL SS SAFETY (BLADE) ×6 IMPLANT
CANISTER SUCT 1200ML W/VALVE (MISCELLANEOUS) ×3 IMPLANT
CHLORAPREP W/TINT 26 (MISCELLANEOUS) ×3 IMPLANT
CLOSURE WOUND 1/2 X4 (GAUZE/BANDAGES/DRESSINGS) ×1
CNTNR SPEC 2.5X3XGRAD LEK (MISCELLANEOUS)
CONT SPEC 4OZ STER OR WHT (MISCELLANEOUS)
CONTAINER SPEC 2.5X3XGRAD LEK (MISCELLANEOUS) IMPLANT
COVER PROBE FLX POLY STRL (MISCELLANEOUS) IMPLANT
COVER WAND RF STERILE (DRAPES) IMPLANT
DEVICE DUBIN SPECIMEN MAMMOGRA (MISCELLANEOUS) IMPLANT
DRAPE LAPAROTOMY 100X77 ABD (DRAPES) ×3 IMPLANT
DRSG GAUZE FLUFF 36X18 (GAUZE/BANDAGES/DRESSINGS) IMPLANT
DRSG TELFA 4X3 1S NADH ST (GAUZE/BANDAGES/DRESSINGS) ×6 IMPLANT
ELECT CAUTERY BLADE TIP 2.5 (TIP) ×3
ELECT REM PT RETURN 9FT ADLT (ELECTROSURGICAL) ×3
ELECTRODE CAUTERY BLDE TIP 2.5 (TIP) ×1 IMPLANT
ELECTRODE REM PT RTRN 9FT ADLT (ELECTROSURGICAL) ×1 IMPLANT
GLOVE BIO SURGEON STRL SZ7.5 (GLOVE) ×3 IMPLANT
GLOVE INDICATOR 8.0 STRL GRN (GLOVE) ×3 IMPLANT
GOWN STRL REUS W/ TWL LRG LVL3 (GOWN DISPOSABLE) ×3 IMPLANT
GOWN STRL REUS W/TWL LRG LVL3 (GOWN DISPOSABLE) ×6
KIT TURNOVER KIT A (KITS) ×3 IMPLANT
LABEL OR SOLS (LABEL) IMPLANT
MARGIN MAP 10MM (MISCELLANEOUS) IMPLANT
NEEDLE HYPO 22GX1.5 SAFETY (NEEDLE) ×3 IMPLANT
NEEDLE HYPO 25X1 1.5 SAFETY (NEEDLE) ×3 IMPLANT
PACK BASIN MINOR ARMC (MISCELLANEOUS) ×3 IMPLANT
RETRACTOR RING XSMALL (MISCELLANEOUS) IMPLANT
RTRCTR WOUND ALEXIS 13CM XS SH (MISCELLANEOUS)
SHEARS HARMONIC 9CM CVD (BLADE) IMPLANT
STRIP CLOSURE SKIN 1/2X4 (GAUZE/BANDAGES/DRESSINGS) ×2 IMPLANT
SUT ETHILON 3-0 FS-10 30 BLK (SUTURE)
SUT MNCRL 3-0 UNDYED SH (SUTURE) ×1 IMPLANT
SUT MONOCRYL 3-0 UNDYED (SUTURE) ×2
SUT VIC AB 2-0 CT1 27 (SUTURE) ×2
SUT VIC AB 2-0 CT1 TAPERPNT 27 (SUTURE) ×1 IMPLANT
SUT VIC AB 4-0 FS2 27 (SUTURE) ×3 IMPLANT
SUTURE EHLN 3-0 FS-10 30 BLK (SUTURE) IMPLANT
SWABSTK COMLB BENZOIN TINCTURE (MISCELLANEOUS) ×3 IMPLANT
SYR 10ML LL (SYRINGE) ×3 IMPLANT
TAPE TRANSPORE STRL 2 31045 (GAUZE/BANDAGES/DRESSINGS) IMPLANT
WATER STERILE IRR 1000ML POUR (IV SOLUTION) IMPLANT

## 2019-01-12 NOTE — Anesthesia Procedure Notes (Signed)
Procedure Name: LMA Insertion Date/Time: 01/12/2019 11:47 AM Performed by: Iran Planas, CRNA Pre-anesthesia Checklist: Patient identified, Emergency Drugs available, Suction available and Patient being monitored Patient Re-evaluated:Patient Re-evaluated prior to induction Oxygen Delivery Method: Circle system utilized Preoxygenation: Pre-oxygenation with 100% oxygen Induction Type: IV induction LMA: LMA inserted LMA Size: 4.5 Number of attempts: 1 Dental Injury: Teeth and Oropharynx as per pre-operative assessment

## 2019-01-12 NOTE — Transfer of Care (Signed)
Immediate Anesthesia Transfer of Care Note  Patient: Architectural technologist  Procedure(s) Performed: RIGHT EXCISION NIPPLE ABSCESS (Right )  Patient Location: PACU  Anesthesia Type:General  Level of Consciousness: drowsy and patient cooperative  Airway & Oxygen Therapy: Patient Spontanous Breathing and Patient connected to face mask oxygen  Post-op Assessment: Report given to RN and Post -op Vital signs reviewed and stable  Post vital signs: Reviewed and stable  Last Vitals:  Vitals Value Taken Time  BP 120/72 01/12/2019 12:24 PM  Temp    Pulse 82 01/12/2019 12:25 PM  Resp 20 01/12/2019 12:25 PM  SpO2 100 % 01/12/2019 12:25 PM  Vitals shown include unvalidated device data.  Last Pain:  Vitals:   01/12/19 1024  TempSrc: Temporal  PainSc: 0-No pain         Complications: No apparent anesthesia complications

## 2019-01-12 NOTE — Anesthesia Preprocedure Evaluation (Addendum)
Anesthesia Evaluation  Patient identified by MRN, date of birth, ID band Patient awake    Reviewed: Allergy & Precautions, H&P , NPO status , Patient's Chart, lab work & pertinent test results  Airway Mallampati: I  TM Distance: >3 FB     Dental  (+) Teeth Intact   Pulmonary former smoker,           Cardiovascular + dysrhythmias Supra Ventricular Tachycardia      Neuro/Psych PSYCHIATRIC DISORDERS Anxiety Depression negative neurological ROS     GI/Hepatic Neg liver ROS, GERD  Controlled,IBS   Endo/Other  negative endocrine ROS  Renal/GU      Musculoskeletal   Abdominal   Peds  Hematology  (+) Blood dyscrasia, anemia ,   Anesthesia Other Findings Past Medical History: No date: Allergy No date: Anemia     Comment:  DURING PREGNANCY No date: Anxiety No date: Depression No date: Gastritis No date: GERD (gastroesophageal reflux disease) No date: IBS (irritable bowel syndrome) No date: Murmur No date: SVT (supraventricular tachycardia) (HCC) No date: Tendonitis     Comment:  ARM RIGHT  Past Surgical History: No date: catheter abaltion to heart     Comment:  Treated SVT as teenager No date: CHOLECYSTECTOMY No date: TONSILLECTOMY No date: TUBAL LIGATION  BMI    Body Mass Index:  31.09 kg/m      Reproductive/Obstetrics negative OB ROS                            Anesthesia Physical Anesthesia Plan  ASA: II  Anesthesia Plan: General LMA   Post-op Pain Management:    Induction:   PONV Risk Score and Plan: Dexamethasone, Ondansetron, Midazolam and Treatment may vary due to age or medical condition  Airway Management Planned:   Additional Equipment:   Intra-op Plan:   Post-operative Plan:   Informed Consent: I have reviewed the patients History and Physical, chart, labs and discussed the procedure including the risks, benefits and alternatives for the proposed anesthesia  with the patient or authorized representative who has indicated his/her understanding and acceptance.     Dental Advisory Given  Plan Discussed with: Anesthesiologist and CRNA  Anesthesia Plan Comments:         Anesthesia Quick Evaluation

## 2019-01-12 NOTE — Anesthesia Post-op Follow-up Note (Signed)
Anesthesia QCDR form completed.        

## 2019-01-12 NOTE — Discharge Instructions (Signed)

## 2019-01-12 NOTE — H&P (Signed)
No change in clinical exam or history. For right nipple/ areolar surgery.

## 2019-01-12 NOTE — Anesthesia Postprocedure Evaluation (Signed)
Anesthesia Post Note  Patient: Architectural technologist  Procedure(s) Performed: RIGHT EXCISION NIPPLE ABSCESS (Right )  Patient location during evaluation: PACU Anesthesia Type: General Level of consciousness: awake and alert Pain management: pain level controlled Vital Signs Assessment: post-procedure vital signs reviewed and stable Respiratory status: spontaneous breathing, nonlabored ventilation and respiratory function stable Cardiovascular status: blood pressure returned to baseline and stable Postop Assessment: no apparent nausea or vomiting Anesthetic complications: no     Last Vitals:  Vitals:   01/12/19 1338 01/12/19 1355  BP: 115/73 123/85  Pulse: 76 76  Resp: 13 18  Temp: (!) 36.3 C 36.5 C  SpO2: 94% 99%    Last Pain:  Vitals:   01/12/19 1355  TempSrc: Temporal  PainSc: 4                  Jovita Gamma

## 2019-01-12 NOTE — Op Note (Signed)
Preoperative diagnosis: Chronic right nipple abscess.  Postoperative diagnosis: Same.  Operative procedure: Excision right nipple areolar abscess.  Operating Surgeon: Donnalee Curry, MD.  Anesthesia: General by LMA, Marcaine 0.5% with 1 to 200,000 units of epinephrine, 30 cc.  Estimated blood loss: 2 cc.  Clinical note: This 38 year old woman has had a chronic recurring abscess involving the upper outer aspect of the right nipple areolar complex.  She was admitted for elective excision.  She received Kefzol prior to the procedure.  Operative note: The area was cleansed with ChloraPrep and draped.  Local anesthesia was infiltrated 6 cm around the edge of the areola to minimize the chance for nipple necrosis.  The area of abscess involve the shaft of the nipple and extended down onto the base of the areola.  This was excised sharply through an elliptical incision.  There was no purulent material at this time.  The tissue was excised back to healthy tissue.  The deep tissue was approximated with interrupted 3-0 Monocryl figure-of-eight sutures.  The skin was approximated with interrupted 5-0 Monocryl skin sutures.  Bacitracin ointment followed by Telfa and a light Tegaderm dressing was applied.  The patient tolerated the procedure well and was taken to recovery in stable condition.

## 2019-01-13 ENCOUNTER — Encounter: Payer: Self-pay | Admitting: General Surgery

## 2019-01-13 LAB — SURGICAL PATHOLOGY

## 2019-01-14 ENCOUNTER — Telehealth: Payer: Self-pay

## 2019-01-14 NOTE — Telephone Encounter (Signed)
Notified patient as instructed of pathology results, patient pleased. Discussed follow-up appointments, patient agrees.

## 2019-01-20 ENCOUNTER — Other Ambulatory Visit: Payer: Self-pay

## 2019-01-20 ENCOUNTER — Ambulatory Visit (INDEPENDENT_AMBULATORY_CARE_PROVIDER_SITE_OTHER): Payer: No Typology Code available for payment source | Admitting: General Surgery

## 2019-01-20 VITALS — BP 123/81 | HR 96 | Temp 98.1°F | Ht 66.0 in | Wt 160.0 lb

## 2019-01-20 DIAGNOSIS — N6001 Solitary cyst of right breast: Secondary | ICD-10-CM

## 2019-01-20 NOTE — Progress Notes (Signed)
Patient ID: Tammy Bennett, female   DOB: 02/15/1981, 38 y.o.   MRN: [id number]  Chief Complaint  Patient presents with  . Follow-up    HPI Tammy Bennett is a 38 y.o. female here today for her post op right nipple incision done on 01/12/2019. Patient states she feel short of breath with activity.  HPI  Past Medical History:  Diagnosis Date  . Allergy   . Anemia    DURING PREGNANCY  . Anxiety   . Depression   . Gastritis   . GERD (gastroesophageal reflux disease)   . IBS (irritable bowel syndrome)   . Murmur   . SVT (supraventricular tachycardia) (HCC)   . Tendonitis    ARM RIGHT    Past Surgical History:  Procedure Laterality Date  . catheter abaltion to heart     Treated SVT as teenager  . CHOLECYSTECTOMY    . EVACUATION BREAST HEMATOMA Right 01/12/2019   Procedure: RIGHT EXCISION NIPPLE ABSCESS;  Surgeon: Earline MayotteByrnett, Jeffrey W, MD;  Location: [facility] ORS;  Service: General;  Laterality: Right;  . TONSILLECTOMY    . TUBAL LIGATION      Family History  Problem Relation Age of Onset  . Stroke Mother   . Stomach cancer Maternal Grandmother   . Leukemia Maternal Grandfather   . Pancreatic cancer Paternal Grandmother   . Healthy Brother   . Sickle cell anemia Half-Sister   . Crohn's disease Half-Sister   . Ulcerative colitis Half-Sister   . Thyroid disease Cousin   . Breast cancer Maternal Aunt [id number]    Social History Social History   Tobacco Use  . Smoking status: Former Smoker    Types: Cigarettes  . Smokeless tobacco: Never Used  Substance Use Topics  . Alcohol use: Yes    Comment: OCC  . Drug use: Not Currently    Types: Marijuana    Comment: none in years    Allergies  Allergen Reactions  . Bacitracin Rash    Current Outpatient Medications  Medication Sig Dispense Refill  . cetirizine (ZYRTEC) 10 MG tablet Take 1 tablet (10 mg total) by mouth daily. (Patient taking differently: Take 10 mg by mouth every morning. )    . fluticasone (FLONASE) 50 MCG/ACT  nasal spray Place 2 sprays into both nostrils daily. 16 g 6  . HYDROcodone-acetaminophen (NORCO/VICODIN) 5-325 MG tablet Take 1 tablet by mouth every 4 (four) hours as needed for moderate pain. 12 tablet 0  . hydrOXYzine (ATARAX/VISTARIL) 25 MG tablet Take 1 tablet (25 mg total) by mouth 3 (three) times daily as needed for anxiety. 30 tablet 0  . hydrOXYzine (ATARAX/VISTARIL) 25 MG tablet Take 25 mg by mouth 3 (three) times daily as needed.    Marland Kitchen. levonorgestrel (MIRENA, 52 MG,) 20 MCG/24HR IUD 1 each by Intrauterine route once.    . mirtazapine (REMERON) 7.5 MG tablet Take 1 tablet (7.5 mg total) by mouth at bedtime. 30 tablet 2  . sertraline (ZOLOFT) 50 MG tablet Take 1 tablet (50 mg total) by mouth daily. (Patient taking differently: Take 50 mg by mouth daily. PT NOT TO START UNTIL AFTER SURGERY ON 01-12-19) 30 tablet 6  . citalopram (CELEXA) 10 MG tablet Take 1 tablet (10 mg total) by mouth daily for 30 days. (Patient not taking: Reported on 01/07/2019) 30 tablet 0   No current facility-administered medications for this visit.     Review of Systems Review of Systems  Constitutional: Negative.   Respiratory: Positive for shortness of breath.  Cardiovascular: Negative.     Blood pressure 123/81, pulse 96, temperature 98.1 F (36.7 C), temperature source Skin, height 5\' 6"  (1.676 m), weight 160 lb (72.6 kg), last menstrual period 12/29/2018, SpO2 98 %.  Physical Exam Physical Exam Constitutional:      Appearance: Normal appearance.  Cardiovascular:     Rate and Rhythm: Normal rate and regular rhythm.  Pulmonary:     Effort: Pulmonary effort is normal.     Breath sounds: Normal breath sounds.  Neurological:     Mental Status: She is alert.    Suture was removed.   Data Reviewed Pathology of January 12, 2019: DIAGNOSIS:  A. BREAST, RIGHT; DEBRIDEMENT:  - SKIN AND SUBCUTANEOUS TISSUE WITH MILD FIBROSIS/SCARRING.  - CHRONICALLY INFLAMED MAMMARY DUCT EPITHELIUM.  - NEGATIVE FOR ATYPICAL  PROLIFERATIVE BREAST DISEASE.   Assessment Doing well post excision of chronic inflammatory process involving the right nipple.  Plan Return as needed. The patient is aware to call back for any questions or concerns.  Neosporin or triple antibiotic ointment to the nipple/areolar skin until complete healing has occurred. The patient is aware to call back for any questions or concerns.   HPI, Physical Exam, Assessment and Plan have been scribed under the direction and in the presence of Hervey Ard, MD.  Gaspar Cola, CMA  I have completed the exam and reviewed the above documentation for accuracy and completeness.  I agree with the above.  Haematologist has been used and any errors in dictation or transcription are unintentional.  Hervey Ard, M.D., F.A.C.S.  Forest Gleason Byrnett 01/23/2019, 2:02 PM

## 2019-01-25 ENCOUNTER — Other Ambulatory Visit: Payer: Self-pay

## 2019-01-25 ENCOUNTER — Encounter: Payer: Self-pay | Admitting: Family Medicine

## 2019-01-25 ENCOUNTER — Ambulatory Visit (INDEPENDENT_AMBULATORY_CARE_PROVIDER_SITE_OTHER): Payer: No Typology Code available for payment source | Admitting: Family Medicine

## 2019-01-25 DIAGNOSIS — F410 Panic disorder [episodic paroxysmal anxiety] without agoraphobia: Secondary | ICD-10-CM | POA: Diagnosis not present

## 2019-01-25 DIAGNOSIS — F5104 Psychophysiologic insomnia: Secondary | ICD-10-CM

## 2019-01-25 DIAGNOSIS — F3281 Premenstrual dysphoric disorder: Secondary | ICD-10-CM | POA: Diagnosis not present

## 2019-01-25 MED ORDER — HYDROXYZINE HCL 25 MG PO TABS: 25.0000 mg | ORAL_TABLET | Freq: Two times a day (BID) | ORAL | 2 refills | Status: DC | PRN

## 2019-01-25 MED ORDER — MIRTAZAPINE 7.5 MG PO TABS: 7.5000 mg | ORAL_TABLET | Freq: Every day | ORAL | 2 refills | Status: DC

## 2019-01-25 MED ORDER — SERTRALINE HCL 50 MG PO TABS: 75.0000 mg | ORAL_TABLET | Freq: Every day | ORAL | 2 refills | Status: DC

## 2019-01-25 NOTE — Progress Notes (Signed)
Virtual Visit via Telephone The purpose of this virtual visit is to provide medical care while limiting exposure to the novel coronavirus (COVID19) for both patient and office staff.  Consent was obtained for phone visit:  Yes.   Answered questions that patient had about telehealth interaction:  Yes.   I discussed the limitations, risks, security and privacy concerns of performing an evaluation and management service by telephone. I also discussed with the patient that there may be a patient responsible charge related to this service. The patient expressed understanding and agreed to proceed.  Patient Location: Home Provider Location: [facility] (Office)  PCP is Wilhelmina McardleLauren Kennedy, AGPCNP-BC - I am currently covering during her maternity leave.   ---------------------------------------------------------------------- Chief Complaint  Patient presents with  . Anxiety    S: Reviewed CMA documentation. I have called patient and gathered additional HPI as follows:  Anxiety with Panic attacks / Depression Known chronic history of depression and anxiety, [place], she was on Lorazepam for acute anxiety, for about 1 year, she was taking Lorazepam intermittently sometimes daily or PRN weekly and occasionally once a month. Since down in [place] she has been on different medication with hydroxyzine 25mg  TID PRN for acute  With benefit but not always resolving acute anxiety Reports that symptoms started 2-3 weeks with worsening mood and anxiety, now with some panic attacks twice now in past 2 weeks, with variety of symptoms hyperventilation and crying spel - Most triggers for her mood and anxiety have been coronavirus pandemic and out of work and other social / life stressors  Denies any high risk travel to areas of current concern for COVID19. Denies any known or suspected exposure to person with or possibly with COVID19.  Denies any fevers, chills, sweats, body ache,  cough, shortness of breath, sinus pain or pressure, headache, abdominal pain, diarrhea  Past Medical History:  Diagnosis Date  . Allergy   . Anemia    DURING PREGNANCY  . Anxiety   . Depression   . Gastritis   . GERD (gastroesophageal reflux disease)   . IBS (irritable bowel syndrome)   . Murmur   . SVT (supraventricular tachycardia) (HCC)   . Tendonitis    ARM RIGHT   Social History   Tobacco Use  . Smoking status: Former Smoker    Types: Cigarettes  . Smokeless tobacco: Never Used  Substance Use Topics  . Alcohol use: Yes    Comment: OCC  . Drug use: Not Currently    Types: Marijuana    Comment: none in years    Current Outpatient Medications:  .  cetirizine (ZYRTEC) 10 MG tablet, Take 1 tablet (10 mg total) by mouth daily. (Patient taking differently: Take 10 mg by mouth every morning. ), Disp: , Rfl:  .  fluticasone (FLONASE) 50 MCG/ACT nasal spray, Place 2 sprays into both nostrils daily., Disp: 16 g, Rfl: 6 .  levonorgestrel (MIRENA, 52 MG,) 20 MCG/24HR IUD, 1 each by Intrauterine route once., Disp: , Rfl:  .  mirtazapine (REMERON) 7.5 MG tablet, Take 1 tablet (7.5 mg total) by mouth at bedtime., Disp: 30 tablet, Rfl: 2 .  sertraline (ZOLOFT) 50 MG tablet, Take 1.5 tablets (75 mg total) by mouth daily., Disp: 45 tablet, Rfl: 2 .  hydrOXYzine (ATARAX/VISTARIL) 25 MG tablet, Take 1 tablet (25 mg total) by mouth 2 (two) times daily as needed for anxiety. May take 1 additional pill per dose if acute panic attack. Max 3 pills in  24 hours, Disp: 30 tablet, Rfl: 2  [facility] 01/25/2019 12/20/2018 10/13/2018  Decreased Interest 2 0 1  Down, Depressed, Hopeless 2 0 1  PHQ - 2 Score 4 0 2  Altered sleeping 2 1 3   Tired, decreased energy 2 1 3   Change in appetite 2 1 3   Feeling bad or failure about yourself  1 0 2  Trouble concentrating 2 1 3   Moving slowly or fidgety/restless 0 0 0  Suicidal thoughts 0 0 0  PHQ-9 Score 13 4 16   Difficult doing work/chores  Somewhat difficult Not difficult at all Very difficult    GAD 7 : Generalized Anxiety Score 01/25/2019 12/20/2018 10/13/2018  Nervous, Anxious, on Edge 3 1 3   Control/stop worrying 3 1 3   Worry too much - different things 3 1 3   Trouble relaxing 2 1 3   Restless 1 0 3  Easily annoyed or irritable 2 1 3   Afraid - awful might happen 1 0 3  Total GAD 7 Score 15 5 21   Anxiety Difficulty Not difficult at all Not difficult at all Very difficult    -------------------------------------------------------------------------- O: No physical exam performed due to remote telephone encounter.  Lab results reviewed.  Recent Results (from the past 2160 hour(s))  Cytology - PAP     Status: None   Collection Time: 12/31/18 12:00 AM  Result Value Ref Range   Adequacy      Satisfactory for evaluation  endocervical/transformation zone component PRESENT.   Diagnosis      NEGATIVE FOR INTRAEPITHELIAL LESIONS OR MALIGNANCY.   HPV NOT DETECTED     Comment: Normal Reference Range - NOT Detected   Material Submitted CervicoVaginal Pap [ThinPrep Imaged]    CYTOLOGY - PAP PAP RESULT   Novel Coronavirus, NAA (hospital order; send-out to ref lab)     Status: None   Collection Time: 01/07/19 11:32 AM   Specimen: Nasopharyngeal Swab; Respiratory  Result Value Ref Range   SARS-CoV-2, NAA NOT DETECTED NOT DETECTED    Comment: (NOTE) This test was developed and its performance characteristics determined by [facility]. This test has not been FDA cleared or approved. This test has been authorized by FDA under an Emergency Use Authorization (EUA). This test is only authorized for the duration of time the declaration that circumstances exist justifying the authorization of the emergency use of in vitro diagnostic tests for detection of SARS-CoV-2 virus and/or diagnosis of COVID-19 infection under section 564(b)(1) of the Act, 21 U.S.C. 161WRU-0(A)(5360bbb-3(b)(1), unless the authorization is terminated or  revoked sooner. When diagnostic testing is negative, the possibility of a false negative result should be considered in the context of a patient's recent exposures and the presence of clinical signs and symptoms consistent with COVID-19. An individual without symptoms of COVID-19 and who is not shedding SARS-CoV-2 virus would expect to have a negative (not detected) result in this assay. Performed  At: [facility] [address] [place], [place] [id number] Jolene SchimkeNagendra Sanjai MD NW:[id number]:[phone]    Coronavirus Source NASOPHARYNGEAL     Comment: Performed at [facility], [address] [place], [place] [id number]  Pregnancy, urine POC     Status: None   Collection Time: 01/12/19 10:33 AM  Result Value Ref Range   Preg Test, Ur NEGATIVE NEGATIVE    Comment:        THE SENSITIVITY OF THIS METHODOLOGY IS >24 mIU/mL   Surgical pathology     Status: None   Collection Time: 01/12/19 12:02  PM  Result Value Ref Range   SURGICAL PATHOLOGY      Surgical Pathology CASE: [id number] PATIENT: Yani Medaglia Surgical Pathology Report     SPECIMEN SUBMITTED: A. Nipple/areolar abscess, right, debridement  CLINICAL HISTORY: None provided  PRE-OPERATIVE DIAGNOSIS: Nipple abscess right  POST-OPERATIVE DIAGNOSIS: Same as pre-op     DIAGNOSIS: A. BREAST, RIGHT; DEBRIDEMENT: - SKIN AND SUBCUTANEOUS TISSUE WITH MILD FIBROSIS/SCARRING. - CHRONICALLY INFLAMED MAMMARY DUCT EPITHELIUM. - NEGATIVE FOR ATYPICAL PROLIFERATIVE BREAST DISEASE.  GROSS DESCRIPTION: A. Labeled: Right nipple/areolar abscess cavity Received: Formalin Accompanying specimen radiograph: No Radiographic findings: Not applicable Time in fixative: Collected and placed into formalin at 12:02 PM on 01/12/2019. Cold ischemic time: Less than 1 hour Total fixation time: 9.5 hours Type of procedure: Breast excisional biopsy Location / laterality of specimen: Right breast; nipple/areola Orientation of  specimen: The specimen is received in 2  fragments; designated as fragments #1-#2.  The specimen is received with no orientating material. Inking: The surgical resection margin of fragment #1 is inked blue, and the surgical resection margin of fragment #2 is inked black. Size of specimen: Fragment #1 - 1.4 x 0.7 x 0.6 cm.  Fragment #2 - 0.7 x 0.6 x 0.5 cm. Description of tissue: Fragment #1 - Received is an irregular, unoriented fragment of tan-yellow fibroadipose tissue with an attached portion of tan-brown, grossly unremarkable skin.  No abnormalities or mass lesions are grossly identified.  Fragment #2 - Received is an irregular, unoriented fragment of tan-yellow fibroadipose tissue.  No abnormalities or mass lesions are grossly identified.  Block summary: 1 - entire, trisected fragment #1 2 - entire, bisected fragment #2   Final Diagnosis performed by Allena Napoleon, MD.   Electronically signed 01/13/2019 4:16:13PM The electronic signature indicates that the named Attending Pathologist has evaluated the s pecimen  Technical component performed at [facility], [address], [place], [place] [address] Lab: [id number] Dir: Rush Farmer, MD, MMM  Professional component performed at [facility], [facility], [place], [place], [place] [address] Lab: [phone] Dir: Dellia Nims. Rubinas, MD     -------------------------------------------------------------------------- A&P:  Problem List Items Addressed This Visit    Severe anxiety with panic - Primary   Relevant Medications   mirtazapine (REMERON) 7.5 MG tablet   hydrOXYzine (ATARAX/VISTARIL) 25 MG tablet   sertraline (ZOLOFT) 50 MG tablet   Other Relevant Orders   Ambulatory referral to Social Work    Other Visit Diagnoses    Psychophysiological insomnia       Relevant Medications   mirtazapine (REMERON) 7.5 MG tablet   Other Relevant Orders   Ambulatory referral to Social Work   PMDD (premenstrual  dysphoric disorder)       Relevant Medications   mirtazapine (REMERON) 7.5 MG tablet   hydrOXYzine (ATARAX/VISTARIL) 25 MG tablet   sertraline (ZOLOFT) 50 MG tablet     Persistent acute anxiety, with panic, worsening life stressors Comorbid insomnia, mood with depression vs adjustment  Plan - Increase Sertraline from 50 up to 75mg  daily,  New rx sent, can go up to 100 in future if need - Refill MIrtazapine for mood and insomnia - Deferred decision today to rx previous lorazepam, [place], advised that I would prefer to avoid resuming BDZ at this time, but it can be option in future if no benefit on adjusting dose of current medications - Refill Hydroxyzine 25mg  - new instructions BID PRN + 1 additional dose up to max 50mg  at once  for acute severe anxiety panic, max daily dose is 3 pills in 24 hours - Re-submitted referral to therapist - Delight Ovenssman Mostafa LCSW   Orders Placed This Encounter  Procedures  . Ambulatory referral to Social Work    Referral Priority:   Routine    Referral Type:   Consultation    Referral Reason:   Specialty Services Required    Referred to Provider:   Delight OvensMostafa, Osman, LCSW    Number of Visits Requested:   1     Meds ordered this encounter  Medications  . mirtazapine (REMERON) 7.5 MG tablet    Sig: Take 1 tablet (7.5 mg total) by mouth at bedtime.    Dispense:  30 tablet    Refill:  2  . hydrOXYzine (ATARAX/VISTARIL) 25 MG tablet    Sig: Take 1 tablet (25 mg total) by mouth 2 (two) times daily as needed for anxiety. May take 1 additional pill per dose if acute panic attack. Max 3 pills in 24 hours    Dispense:  30 tablet    Refill:  2  . sertraline (ZOLOFT) 50 MG tablet    Sig: Take 1.5 tablets (75 mg total) by mouth daily.    Dispense:  45 tablet    Refill:  2    Follow-up: - Return in 6 weeks to 3 months for Mood / Anxiety with PCP  Patient verbalizes understanding with the above medical recommendations including the  limitation of remote medical advice.  Specific follow-up and call-back criteria were given for patient to follow-up or seek medical care more urgently if needed.   - Time spent in direct consultation with patient on phone: 15 minutes  Saralyn PilarAlexander Arzella Rehmann, DO [facility] 01/25/2019, 4:29 PM

## 2019-01-25 NOTE — Patient Instructions (Addendum)
Referral sent again to   [facility], LCSW [address] [place], [place]: [phone]  Stay tuned for apt. Call if you don't hear back in a week.  INCREASE Sertraline (Zoloft) from 50mg  daily up to 75mg  daily (take one and half pills daily) - if 2-4 weeks go by and it is improved, and you want to try higher dose 100mg  that is reasonable, let us know.  Refilled Hydroxyzine for acute anxiety and panic, can take TWO pills = 50mg  at once if severe panic, caution sedation, only do this once in a while, may be sedated, can take max of 3 pills in 24 hours  Refilled Mirtazapine  Please schedule a Follow-up Appointment to: Return in about 3 months (around 04/27/2019) for Mood, Anxiety, med adjust w/ PCP.  If you have any other questions or concerns, please feel free to call the office or send a message through [place]. You may also schedule an earlier appointment if necessary.  Additionally, you may be receiving a survey about your experience at our office within a few days to 1 week by e-mail or mail. We value your feedback.  [name], DO [place]

## 2019-02-02 ENCOUNTER — Ambulatory Visit: Payer: No Typology Code available for payment source | Admitting: Obstetrics and Gynecology

## 2019-02-02 ENCOUNTER — Ambulatory Visit: Payer: No Typology Code available for payment source

## 2019-02-23 ENCOUNTER — Encounter: Payer: Self-pay | Admitting: Emergency Medicine

## 2019-02-23 ENCOUNTER — Ambulatory Visit (INDEPENDENT_AMBULATORY_CARE_PROVIDER_SITE_OTHER): Payer: No Typology Code available for payment source | Admitting: Advanced Practice Midwife

## 2019-02-23 ENCOUNTER — Other Ambulatory Visit: Payer: Self-pay

## 2019-02-23 ENCOUNTER — Telehealth: Payer: Self-pay

## 2019-02-23 ENCOUNTER — Emergency Department: Payer: No Typology Code available for payment source

## 2019-02-23 ENCOUNTER — Encounter: Payer: Self-pay | Admitting: Advanced Practice Midwife

## 2019-02-23 VITALS — BP 118/78 | Ht 65.0 in | Wt 166.0 lb

## 2019-02-23 DIAGNOSIS — N941 Unspecified dyspareunia: Secondary | ICD-10-CM | POA: Diagnosis not present

## 2019-02-23 DIAGNOSIS — R1032 Left lower quadrant pain: Secondary | ICD-10-CM | POA: Insufficient documentation

## 2019-02-23 DIAGNOSIS — Z87891 Personal history of nicotine dependence: Secondary | ICD-10-CM | POA: Diagnosis not present

## 2019-02-23 DIAGNOSIS — R102 Pelvic and perineal pain: Secondary | ICD-10-CM | POA: Diagnosis not present

## 2019-02-23 DIAGNOSIS — Z79899 Other long term (current) drug therapy: Secondary | ICD-10-CM | POA: Diagnosis not present

## 2019-02-23 LAB — URINALYSIS, COMPLETE (UACMP) WITH MICROSCOPIC
Bilirubin Urine: NEGATIVE
Glucose, UA: NEGATIVE mg/dL
Ketones, ur: NEGATIVE mg/dL
Nitrite: NEGATIVE
Protein, ur: NEGATIVE mg/dL
Specific Gravity, Urine: 1.021 (ref 1.005–1.030)
pH: 6 (ref 5.0–8.0)

## 2019-02-23 LAB — CBC
HCT: 37.7 % (ref 36.0–46.0)
Hemoglobin: 12.8 g/dL (ref 12.0–15.0)
MCH: 30 pg (ref 26.0–34.0)
MCHC: 34 g/dL (ref 30.0–36.0)
MCV: 88.3 fL (ref 80.0–100.0)
Platelets: 314 10*3/uL (ref 150–400)
RBC: 4.27 MIL/uL (ref 3.87–5.11)
RDW: 12.8 % (ref 11.5–15.5)
WBC: 8.4 10*3/uL (ref 4.0–10.5)
nRBC: 0 % (ref 0.0–0.2)

## 2019-02-23 LAB — COMPREHENSIVE METABOLIC PANEL
ALT: 9 U/L (ref 0–44)
AST: 15 U/L (ref 15–41)
Albumin: 4.2 g/dL (ref 3.5–5.0)
Alkaline Phosphatase: 57 U/L (ref 38–126)
Anion gap: 7 (ref 5–15)
BUN: 11 mg/dL (ref 6–20)
CO2: 23 mmol/L (ref 22–32)
Calcium: 8.8 mg/dL — ABNORMAL LOW (ref 8.9–10.3)
Chloride: 108 mmol/L (ref 98–111)
Creatinine, Ser: 0.7 mg/dL (ref 0.44–1.00)
GFR calc Af Amer: >60 mL/min (ref >60)
GFR calc non Af Amer: >60 mL/min (ref >60)
Glucose, Bld: 107 mg/dL — ABNORMAL HIGH (ref 70–99)
Potassium: 3.5 mmol/L (ref 3.5–5.1)
Sodium: 138 mmol/L (ref 135–145)
Total Bilirubin: 0.6 mg/dL (ref 0.3–1.2)
Total Protein: 7.1 g/dL (ref 6.5–8.1)

## 2019-02-23 LAB — LIPASE, BLOOD: Lipase: 27 U/L (ref 11–51)

## 2019-02-23 LAB — POCT PREGNANCY, URINE: Preg Test, Ur: NEGATIVE

## 2019-02-23 NOTE — Progress Notes (Signed)
Patient ID: Tammy Bennett, female   DOB: 09/20/1980, 38 y.o.   MRN: [id number]  Reason for Consult: Pelvic Pain    Subjective:     HPI:   Tammy Bennett is a 38 y.o. female G3 P2012 who is being seen for left pelvic pain. The pain started today and is sharp and constant with periods of dullness. The pain wraps around to her back. This morning she also had nausea and vomiting and some loose stool. She denies constipation, fever, chills, shortness of breath. She took a Tylenol#3 which she had left over from surgery which did not offer much relief.  She has had a Mirena IUD for about 2.5 years and has generally not had any bleeding or pain but recently she started having periods that have been painful. She was seen in May due to pain with intercourse. Her IUD strings were in place. She did not have imaging but she was told that she may have a cyst. Her most recent period started 2 days ago.  Past Medical History:  Diagnosis Date  . Allergy   . Anemia    DURING PREGNANCY  . Anxiety   . Depression   . Gastritis   . GERD (gastroesophageal reflux disease)   . IBS (irritable bowel syndrome)   . Murmur   . SVT (supraventricular tachycardia) (HCC)   . Tendonitis    ARM RIGHT   Family History  Problem Relation Age of Onset  . Stroke Mother   . Stomach cancer Maternal Grandmother   . Leukemia Maternal Grandfather   . Pancreatic cancer Paternal Grandmother   . Healthy Brother   . Sickle cell anemia Half-Sister   . Crohn's disease Half-Sister   . Ulcerative colitis Half-Sister   . Thyroid disease Cousin   . Breast cancer Maternal Aunt 48   Past Surgical History:  Procedure Laterality Date  . catheter abaltion to heart     Treated SVT as teenager  . CHOLECYSTECTOMY    . EVACUATION BREAST HEMATOMA Right 01/12/2019   Procedure: RIGHT EXCISION NIPPLE ABSCESS;  Surgeon: Earline MayotteByrnett, Jeffrey W, MD;  Location: [facility] ORS;  Service: General;  Laterality: Right;  . TONSILLECTOMY    . TUBAL  LIGATION      Short Social History:  Social History   Tobacco Use  . Smoking status: Former Smoker    Types: Cigarettes  . Smokeless tobacco: Never Used  Substance Use Topics  . Alcohol use: Yes    Comment: OCC    Allergies  Allergen Reactions  . Bacitracin Rash    Current Outpatient Medications  Medication Sig Dispense Refill  . cetirizine (ZYRTEC) 10 MG tablet Take 1 tablet (10 mg total) by mouth daily. (Patient taking differently: Take 10 mg by mouth every morning. )    . fluticasone (FLONASE) 50 MCG/ACT nasal spray Place 2 sprays into both nostrils daily. 16 g 6  . hydrOXYzine (ATARAX/VISTARIL) 25 MG tablet Take 1 tablet (25 mg total) by mouth 2 (two) times daily as needed for anxiety. May take 1 additional pill per dose if acute panic attack. Max 3 pills in 24 hours 30 tablet 2  . levonorgestrel (MIRENA, 52 MG,) 20 MCG/24HR IUD 1 each by Intrauterine route once.    . mirtazapine (REMERON) 7.5 MG tablet Take 1 tablet (7.5 mg total) by mouth at bedtime. 30 tablet 2  . sertraline (ZOLOFT) 50 MG tablet Take 1.5 tablets (75 mg total) by mouth daily. 45 tablet 2   No current facility-administered medications  for this visit.     Review of Systems  Constitutional: Negative.   HENT: Negative.   Eyes: Negative.   Respiratory: Negative.   Cardiovascular: Negative.   Gastrointestinal: Positive for diarrhea, nausea and vomiting.  Genitourinary:       Left pelvic pain  Musculoskeletal: Negative.   Skin: Negative.   Neurological: Negative.   Endo/Heme/Allergies: Bruises/bleeds easily.  Psychiatric/Behavioral: Negative.         Objective:  Objective   Vitals:   02/23/19 1632  BP: 118/78  Weight: 166 lb (75.3 kg)  Height: 5\' 5"  (1.651 m)   Body mass index is 27.62 kg/m. Constitutional: Well nourished, well developed female in no acute distress.  HEENT: normal Skin: Warm and dry.  Cardiovascular: Regular rate and rhythm.   Respiratory: Clear to auscultation  bilateral. Normal respiratory effort Abdomen: no masses, tender to palpation left lower quadrant Back: no CVAT Neuro: DTRs 2+, Cranial nerves grossly intact Psych: Alert and Oriented x3. No memory deficits. Normal mood and affect.  MS: normal gait, normal bilateral lower extremity ROM/strength/stability.  Pelvic exam:  is not limited by body habitus EGBUS: within normal limits Vagina: within normal limits and with normal mucosa, blood in the vault Cervix: normal appearance, strings visualized Uterus: mild tenderness to palaption Adnexa: increased tenderness to palpation on left       Assessment/Plan:     38 yo G3 P 2012 female with acute left pelvic pain  Return to clinic for gyn ultrasound and follow up  [place] 02/23/2019, 5:15 PM

## 2019-02-23 NOTE — ED Triage Notes (Signed)
Pt presents to ED c/o LLQ pain starting today. Saw OBGYN today with recent dx of cyst on L side. Pt states she is scheduled for [place] next week but could not wait.

## 2019-02-23 NOTE — Telephone Encounter (Signed)
Acknowledged.  Patient with apt at GYN today  [name], [facility] 02/23/2019, 12:02 PM

## 2019-02-23 NOTE — Telephone Encounter (Signed)
Patient called concerned about heavy bleeding during her menstrual  Cycle also back pain and nausea. Has seen obgyn recently and diagnosed with cyst on Left side after ultrasound. Advised patient to call back the GYN office today.

## 2019-02-24 ENCOUNTER — Emergency Department
Admission: EM | Admit: 2019-02-24 | Discharge: 2019-02-24 | Disposition: A | Discharge status: Home / Self Care | Payer: No Typology Code available for payment source | Attending: Emergency Medicine | Admitting: Emergency Medicine

## 2019-02-24 ENCOUNTER — Emergency Department: Payer: No Typology Code available for payment source

## 2019-02-24 DIAGNOSIS — R102 Pelvic and perineal pain: Secondary | ICD-10-CM

## 2019-02-24 DIAGNOSIS — R1032 Left lower quadrant pain: Secondary | ICD-10-CM

## 2019-02-24 MED ORDER — IOHEXOL 300 MG/ML  SOLN
100.0000 mL | Freq: Once | INTRAMUSCULAR | Status: AC | PRN
  Administered 2019-02-24: 03:00:00 100 mL via INTRAVENOUS

## 2019-02-24 MED ORDER — KETOROLAC TROMETHAMINE 30 MG/ML IJ SOLN
15.0000 mg | Freq: Once | INTRAMUSCULAR | Status: AC
  Administered 2019-02-24: 02:00:00 15 mg via INTRAVENOUS
  Filled 2019-02-24: qty 1

## 2019-02-24 MED ORDER — ONDANSETRON HCL 4 MG/2ML IJ SOLN
4.0000 mg | INTRAMUSCULAR | Status: AC
  Administered 2019-02-24: 02:00:00 4 mg via INTRAVENOUS
  Filled 2019-02-24: qty 2

## 2019-02-24 NOTE — ED Provider Notes (Signed)
[facility] Emergency Department Provider Note  ____________________________________________   First MD Initiated Contact with Patient 02/24/19 [phone]     (approximate)  I have reviewed the triage vital signs and the nursing notes.   HISTORY  Chief Complaint Abdominal Pain    HPI Tammy Bennett is a 38 y.o. female with medical history as listed below who presents for evaluation of acute onset persistent moderate sharp left lower quadrant pain starting within the last 24 hours.  She has been told in the past that she has ovarian cyst.  She went to her OB/GYN today at [facility] and was told that she probably has a cyst and she needs an ultrasound but the earliest they can get her in was just over a week.  Given her persistent pain as well as at least one if not multiple episodes of nausea and vomiting, she felt like she needed to be evaluated tonight.  Move around seems to make the pain worse, nothing in particular makes it better.  She feels like she has some swelling and redness down in the left lower quadrant as well.  She has had no urinary symptoms and no vaginal complaints or concerns although she is currently on her period.  She has had some pain during intercourse recently and was concerned about the placement of her IUD.  She has had no contact with COVID-19 patients and she denies fever/chills, sore throat, chest pain, cough, shortness of breath, and dysuria.         Past Medical History:  Diagnosis Date  . Allergy   . Anemia    DURING PREGNANCY  . Anxiety   . Depression   . Gastritis   . GERD (gastroesophageal reflux disease)   . IBS (irritable bowel syndrome)   . Murmur   . SVT (supraventricular tachycardia) ([place])   . Tendonitis    ARM RIGHT    Patient Active Problem List   Diagnosis Date Noted  . Cyst of right nipple 01/06/2019  . 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 Severe anxiety with panic 10/13/2018    Past Surgical  History:  Procedure Laterality Date  . catheter abaltion to heart     Treated SVT as teenager  . CHOLECYSTECTOMY    . EVACUATION BREAST HEMATOMA Right 01/12/2019   Procedure: RIGHT EXCISION NIPPLE ABSCESS;  Surgeon: Robert Bellow, MD;  Location: [facility] ORS;  Service: General;  Laterality: Right;  . TONSILLECTOMY    . TUBAL LIGATION      Prior to Admission medications   Medication Sig Start Date End Date Taking? Authorizing Provider  cetirizine (ZYRTEC) 10 MG tablet Take 1 tablet (10 mg total) by mouth daily. Patient taking differently: Take 10 mg by mouth every morning.  10/13/18   Mikey College, NP  fluticasone (FLONASE) 50 MCG/ACT nasal spray Place 2 sprays into both nostrils daily. 10/13/18   Mikey College, NP  hydrOXYzine (ATARAX/VISTARIL) 25 MG tablet Take 1 tablet (25 mg total) by mouth 2 (two) times daily as needed for anxiety. May take 1 additional pill per dose if acute panic attack. Max 3 pills in 24 hours 01/25/19   Karamalegos, Devonne Doughty, DO  levonorgestrel (MIRENA, 52 MG,) 20 MCG/24HR IUD 1 each by Intrauterine route once.    [provider]  mirtazapine (REMERON) 7.5 MG tablet Take 1 tablet (7.5 mg total) by mouth at bedtime. 01/25/19   Karamalegos, Devonne Doughty, DO  sertraline (ZOLOFT) 50 MG tablet Take  1.5 tablets (75 mg total) by mouth daily. 01/25/19   Karamalegos, Netta NeatAlexander J, DO    Allergies Bacitracin  Family History  Problem Relation Age of Onset  . Stroke Mother   . Stomach cancer Maternal Grandmother   . Leukemia Maternal Grandfather   . Pancreatic cancer Paternal Grandmother   . Healthy Brother   . Sickle cell anemia Half-Sister   . Crohn's disease Half-Sister   . Ulcerative colitis Half-Sister   . Thyroid disease Cousin   . Breast cancer Maternal Aunt [id number]    Social History Social History   Tobacco Use  . Smoking status: Former Smoker    Types: Cigarettes  . Smokeless tobacco: Never Used  Substance Use Topics  . Alcohol use:  Yes    Comment: OCC  . Drug use: Not Currently    Types: Marijuana    Comment: none in years    Review of Systems Constitutional: No fever/chills Eyes: No visual changes. ENT: No sore throat. Cardiovascular: Denies chest pain. Respiratory: Denies shortness of breath. Gastrointestinal: Left lower quadrant pain with nausea and vomiting as described above. Genitourinary: Negative for dysuria. Musculoskeletal: Negative for neck pain.  Negative for back pain. Integumentary: Negative for rash. Neurological: Negative for headaches, focal weakness or numbness.   ____________________________________________   PHYSICAL EXAM:  VITAL SIGNS: ED Triage Vitals  Enc Vitals Group     BP 02/23/19 2138 123/90     Pulse Rate 02/23/19 2138 97     Resp 02/23/19 2138 18     Temp 02/23/19 2138 99.3 F (37.4 C)     Temp Source 02/23/19 2138 Oral     SpO2 02/23/19 2138 95 %     Weight 02/23/19 2139 75.3 kg (166 lb)     Height 02/23/19 2139 1.575 m (5\' 2" )     Head Circumference --      Peak Flow --      Pain Score 02/23/19 2139 10     Pain Loc --      Pain Edu? --      Excl. in GC? --     Constitutional: Alert and oriented. Well appearing and in no acute distress. Eyes: Conjunctivae are normal.  Head: Atraumatic. Nose: No congestion/rhinnorhea. Mouth/Throat: Mucous membranes are moist. Neck: No stridor.  No meningeal signs.   Cardiovascular: Normal rate, regular rhythm. Good peripheral circulation. Grossly normal heart sounds. Respiratory: Normal respiratory effort.  No retractions. No audible wheezing. Gastrointestinal: Soft and nondistended.  I do not appreciate any swelling or redness like the patient described.  She has some tenderness to palpation of the left lower quadrant but no rebound or guarding and no localized peritonitis.  No tenderness on the right lower quadrant or upper abdomen. Genitourinary: Deferred, the patient [facility] and has no  concerns for STD/PID. Musculoskeletal: No lower extremity tenderness nor edema. No gross deformities of extremities. Neurologic:  Normal speech and language. No gross focal neurologic deficits are appreciated.  Skin:  Skin is warm, dry and intact. No rash noted. Psychiatric: Mood and affect are normal. Speech and behavior are normal.  ____________________________________________   LABS (all labs ordered are listed, but only abnormal results are displayed)  Labs Reviewed  COMPREHENSIVE METABOLIC PANEL - Abnormal; Notable for the following components:      Result Value   Glucose, Bld 107 (*)    Calcium 8.8 (*)    All other components within normal limits  URINALYSIS, COMPLETE (UACMP) WITH MICROSCOPIC -  Abnormal; Notable for the following components:   Color, Urine YELLOW (*)    APPearance HAZY (*)    Hgb urine dipstick LARGE (*)    Leukocytes,Ua TRACE (*)    Bacteria, UA RARE (*)    All other components within normal limits  LIPASE, BLOOD  CBC  POC URINE PREG, ED  POCT PREGNANCY, URINE   ____________________________________________  EKG  No indication for EKG ____________________________________________  RADIOLOGY   ED MD interpretation: Transvaginal ultrasound was unremarkable with a normal-appearing uterus, normal placed IUD, normal ovaries. Normal CT abd/pelvis.  Official radiology report(s): Ct Abdomen Pelvis W Contrast  Result Date: 02/24/2019 CLINICAL DATA:  Left lower quadrant pain EXAM: CT ABDOMEN AND PELVIS WITH CONTRAST TECHNIQUE: Multidetector CT imaging of the abdomen and pelvis was performed using the standard protocol following bolus administration of intravenous contrast. CONTRAST:  100mL OMNIPAQUE IOHEXOL 300 MG/ML  SOLN COMPARISON:  CT 05/26/2017 FINDINGS: Lower chest: Lung bases demonstrate no acute consolidation or effusion. The heart size is normal Hepatobiliary: No focal liver abnormality is seen. Status post cholecystectomy. No biliary dilatation.  Pancreas: Unremarkable. No pancreatic ductal dilatation or surrounding inflammatory changes. Spleen: Normal in size without focal abnormality. Adrenals/Urinary Tract: Adrenal glands are unremarkable. Kidneys are normal, without renal calculi, focal lesion, or hydronephrosis. Bladder is unremarkable. Stomach/Bowel: Stomach is within normal limits. Appendix appears normal. No evidence of bowel wall thickening, distention, or inflammatory changes. Vascular/Lymphatic: No significant vascular findings are present. No enlarged abdominal or pelvic lymph nodes. Reproductive: IUD within the uterus.  No adnexal mass. Other: Negative for free air or free fluid. Musculoskeletal: No acute or significant osseous findings. IMPRESSION: Negative. No CT evidence for acute intra-abdominal or pelvic abnormality Electronically Signed   By: Jasmine PangKim  Fujinaga M.D.   On: 02/24/2019 03:19   [place] Pelvic Complete W Transvaginal And Torsion R/o  Result Date: 02/24/2019 CLINICAL DATA:  38 year old female with left lower quadrant pain for 1 day. LMP 2 days ago. EXAM: TRANSABDOMINAL AND TRANSVAGINAL ULTRASOUND OF PELVIS DOPPLER ULTRASOUND OF OVARIES TECHNIQUE: Both transabdominal and transvaginal ultrasound examinations of the pelvis were performed. Transabdominal technique was performed for global imaging of the pelvis including uterus, ovaries, adnexal regions, and pelvic cul-de-sac. It was necessary to proceed with endovaginal exam following the transabdominal exam to visualize the ovaries. Color and duplex Doppler ultrasound was utilized to evaluate blood flow to the ovaries. COMPARISON:  CT Abdomen and Pelvis 05/26/2018. FINDINGS: Uterus Measurements: 8.7 x 5.8 x 4.6 centimeters = volume: 121 mL. Possible small intramural fibroid on image 35 measuring 12 millimeters diameter. Otherwise negative. Endometrium Thickness: 4 millimeters. IUD position appears within normal limits and stable to that in 2019. Right ovary Measurements: 2.9 x 1.9 x 2.6  centimeters = volume: 7 mL. Normal appearance/no adnexal mass. Left ovary Measurements: 3.5 x 1.8 x 2.2 centimeters = volume: 7 mL. Normal appearance/no adnexal mass. Pulsed Doppler evaluation of both ovaries demonstrates normal low-resistance arterial and venous waveforms. Other findings No pelvic free fluid. IMPRESSION: 1. Negative for ovarian torsion and normal ultrasound appearance of the pelvis aside from a possible small, 12 mm intramural fibroid. 2. IUD position appears stable since 2019 and within normal limits. Electronically Signed   By: Odessa FlemingH  Hall M.D.   On: 02/24/2019 00:39     ____________________________________________   PROCEDURES   Procedure(s) performed (including Critical Care):  Procedures   ____________________________________________   INITIAL IMPRESSION / MDM / ASSESSMENT AND PLAN / ED COURSE  As part of my medical decision making, I reviewed  the following data within the electronic MEDICAL RECORD NUMBER Nursing notes reviewed and incorporated, Labs reviewed , Old chart reviewed, Notes from prior ED visits and [place] Controlled Substance Database   Differential diagnosis includes, but is not limited to, ovarian cyst with pelvic free fluid, ovarian torsion, and UTI/Pyelo, STD/PID/TOA, diverticulitis, musculoskeletal pain/strain, atypical presentation of appendicitis.  The patient is well-appearing and in no distress although she does appear little bit uncomfortable.  She has some tenderness without peritonitis on exam with no external abnormalities that would suggest a cutaneous abscess or fluid collection or cellulitis.  Her lab work is reassuring with no leukocytosis and a normal metabolic panel.  Negative urine pregnancy, there is some blood and trace leukocytes in her urine but she reports to me that she is currently on her period.   Ultrasound was reassuring.  I discussed it with the patient and she would like to proceed with a CT scan.  She does not have any concerns about  STD/PID and does not want to proceed with a pelvic exam especially since she was just at the OB/GYN and I think this is understandable and appropriate.  We will proceed with CT abdomen pelvis.  I am giving Toradol 15 mg IV and Zofran 4 mg IV for her symptoms and will reassess after the results of the CT.      Clinical Course as of Feb 24 339  Thu Feb 24, 2019  0338 No acute abnormalities on CT scan.  The patient has been sleeping comfortably.  I updated her about the reassuring results and she is comfortable with plan for discharge and outpatient follow-up.  I gave my usual and customary return precautions.  CT ABDOMEN PELVIS W CONTRAST [CF]    Clinical Course User Index [CF] Loleta RoseForbach, Tyquarius Paglia, MD     ____________________________________________  FINAL CLINICAL IMPRESSION(S) / ED DIAGNOSES  Final diagnoses:  Pelvic pain  Abdominal pain, LLQ (left lower quadrant)     MEDICATIONS GIVEN DURING THIS VISIT:  Medications  ketorolac (TORADOL) 30 MG/ML injection 15 mg (15 mg Intravenous Given 02/24/19 0143)  ondansetron (ZOFRAN) injection 4 mg (4 mg Intravenous Given 02/24/19 0143)  iohexol (OMNIPAQUE) 300 MG/ML solution 100 mL (100 mLs Intravenous Contrast Given 02/24/19 0253)     ED Discharge Orders    None      *Please note:  Tammy QuailsMelissa Bennett was evaluated in Emergency Department on 02/24/2019 for the symptoms described in the history of present illness. She was evaluated in the context of the global COVID-19 pandemic, which necessitated consideration that the patient might be at risk for infection with the SARS-CoV-2 virus that causes COVID-19. Institutional protocols and algorithms that pertain to the evaluation of patients at risk for COVID-19 are in a state of rapid change based on information released by regulatory bodies including the CDC and federal and state organizations. These policies and algorithms were followed during the patient's care in the ED.  Some ED evaluations and  interventions may be delayed as a result of limited staffing during the pandemic.*  Note:  This document was prepared using Dragon voice recognition software and may include unintentional dictation errors.   Loleta RoseForbach, Syerra Abdelrahman, MD 02/24/19 [phone]

## 2019-02-24 NOTE — ED Notes (Signed)
Pt in CT.

## 2019-02-24 NOTE — Discharge Instructions (Signed)

## 2019-03-01 ENCOUNTER — Emergency Department
Admission: EM | Admit: 2019-03-01 | Discharge: 2019-03-01 | Disposition: A | Discharge status: Home / Self Care | Payer: PRIVATE HEALTH INSURANCE | Attending: Emergency Medicine | Admitting: Emergency Medicine

## 2019-03-01 ENCOUNTER — Other Ambulatory Visit: Payer: Self-pay

## 2019-03-01 ENCOUNTER — Other Ambulatory Visit: Payer: No Typology Code available for payment source

## 2019-03-01 ENCOUNTER — Encounter: Payer: Self-pay | Admitting: Emergency Medicine

## 2019-03-01 ENCOUNTER — Ambulatory Visit: Payer: No Typology Code available for payment source | Admitting: Advanced Practice Midwife

## 2019-03-01 DIAGNOSIS — Z87891 Personal history of nicotine dependence: Secondary | ICD-10-CM | POA: Diagnosis not present

## 2019-03-01 DIAGNOSIS — R1032 Left lower quadrant pain: Secondary | ICD-10-CM | POA: Insufficient documentation

## 2019-03-01 LAB — COMPREHENSIVE METABOLIC PANEL
ALT: 9 U/L (ref 0–44)
AST: 13 U/L — ABNORMAL LOW (ref 15–41)
Albumin: 4.1 g/dL (ref 3.5–5.0)
Alkaline Phosphatase: 55 U/L (ref 38–126)
Anion gap: 8 (ref 5–15)
BUN: 17 mg/dL (ref 6–20)
CO2: 23 mmol/L (ref 22–32)
Calcium: 9.2 mg/dL (ref 8.9–10.3)
Chloride: 108 mmol/L (ref 98–111)
Creatinine, Ser: 0.64 mg/dL (ref 0.44–1.00)
GFR calc Af Amer: >60 mL/min (ref >60)
GFR calc non Af Amer: >60 mL/min (ref >60)
Glucose, Bld: 92 mg/dL (ref 70–99)
Potassium: 3.7 mmol/L (ref 3.5–5.1)
Sodium: 139 mmol/L (ref 135–145)
Total Bilirubin: 0.6 mg/dL (ref 0.3–1.2)
Total Protein: 7.2 g/dL (ref 6.5–8.1)

## 2019-03-01 LAB — URINALYSIS, COMPLETE (UACMP) WITH MICROSCOPIC
Bacteria, UA: NONE SEEN
Bilirubin Urine: NEGATIVE
Glucose, UA: NEGATIVE mg/dL
Ketones, ur: NEGATIVE mg/dL
Leukocytes,Ua: NEGATIVE
Nitrite: NEGATIVE
Protein, ur: NEGATIVE mg/dL
Specific Gravity, Urine: 1.023 (ref 1.005–1.030)
WBC, UA: NONE SEEN WBC/hpf (ref 0–5)
pH: 7 (ref 5.0–8.0)

## 2019-03-01 LAB — CBC
HCT: 39.7 % (ref 36.0–46.0)
Hemoglobin: 13 g/dL (ref 12.0–15.0)
MCH: 29.5 pg (ref 26.0–34.0)
MCHC: 32.7 g/dL (ref 30.0–36.0)
MCV: 90 fL (ref 80.0–100.0)
Platelets: 302 10*3/uL (ref 150–400)
RBC: 4.41 MIL/uL (ref 3.87–5.11)
RDW: 12.8 % (ref 11.5–15.5)
WBC: 9.2 10*3/uL (ref 4.0–10.5)
nRBC: 0 % (ref 0.0–0.2)

## 2019-03-01 LAB — POCT PREGNANCY, URINE: Preg Test, Ur: NEGATIVE

## 2019-03-01 LAB — CHLAMYDIA/NGC RT PCR (ARMC ONLY)
Chlamydia Tr: NOT DETECTED
N gonorrhoeae: NOT DETECTED

## 2019-03-01 LAB — LIPASE, BLOOD: Lipase: 24 U/L (ref 11–51)

## 2019-03-01 MED ORDER — OXYCODONE-ACETAMINOPHEN 5-325 MG PO TABS
2.0000 | ORAL_TABLET | Freq: Once | ORAL | Status: AC
  Administered 2019-03-01: 2 via ORAL
  Filled 2019-03-01: qty 2

## 2019-03-01 MED ORDER — SODIUM CHLORIDE 0.9% FLUSH: 3.0000 mL | Freq: Once | INTRAVENOUS | Status: DC

## 2019-03-01 MED ORDER — OXYCODONE-ACETAMINOPHEN 5-325 MG PO TABS: 1.0000 | ORAL_TABLET | Freq: Three times a day (TID) | ORAL | 0 refills | Status: DC | PRN

## 2019-03-01 NOTE — ED Triage Notes (Signed)
Pain left lower quad and nausea. Says this is ongoing and she was seen last week for same.

## 2019-03-01 NOTE — ED Provider Notes (Signed)
[facility] Emergency Department Provider Note       Time seen: ----------------------------------------- 10:13 AM on 03/01/2019 -----------------------------------------  I have reviewed the triage vital signs and the nursing notes.  HISTORY   Chief Complaint Abdominal Pain and Nausea   HPI Tammy Bennett is a 38 y.o. female with a history of anemia, anxiety, depression, gastritis, GERD, SVT who presents to the ED for left lower quadrant pain with nausea.  Patient states this is ongoing and she was seen last week for same.  Pain is 8 out of 10, nothing makes it better or worse.  Past Medical History:  Diagnosis Date  . Allergy   . Anemia    DURING PREGNANCY  . Anxiety   . Depression   . Gastritis   . GERD (gastroesophageal reflux disease)   . IBS (irritable bowel syndrome)   . Murmur   . SVT (supraventricular tachycardia) ([facility])   . Tendonitis    ARM RIGHT    Patient Active Problem List   Diagnosis Date Noted  . Cyst of right nipple 01/06/2019  . 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 Severe anxiety with panic 10/13/2018    Past Surgical History:  Procedure Laterality Date  . catheter abaltion to heart     Treated SVT as teenager  . CHOLECYSTECTOMY    . EVACUATION BREAST HEMATOMA Right 01/12/2019   Procedure: RIGHT EXCISION NIPPLE ABSCESS;  Surgeon: Earline MayotteByrnett, Jeffrey W, MD;  Location: [facility] ORS;  Service: General;  Laterality: Right;  . TONSILLECTOMY    . TUBAL LIGATION      Allergies Bacitracin  Social History Social History   Tobacco Use  . Smoking status: Former Smoker    Types: Cigarettes  . Smokeless tobacco: Never Used  Substance Use Topics  . Alcohol use: Yes    Comment: OCC  . Drug use: Not Currently    Types: Marijuana    Comment: none in years   Review of Systems Constitutional: Negative for fever. Cardiovascular: Negative for chest pain. Respiratory: Negative for shortness of  breath. Gastrointestinal: Positive for abdominal pain, nausea Musculoskeletal: Negative for back pain. Skin: Negative for rash. Neurological: Negative for headaches, focal weakness or numbness.  All systems negative/normal/unremarkable except as stated in the HPI  ____________________________________________   PHYSICAL EXAM:  VITAL SIGNS: ED Triage Vitals [03/01/19 0857]  Enc Vitals Group     BP      Pulse Rate (!) 101     Resp 16     Temp 98.6 F (37 C)     Temp Source Oral     SpO2 99 %     Weight      Height      Head Circumference      Peak Flow      Pain Score 8     Pain Loc      Pain Edu?      Excl. in GC?    Constitutional: Alert and oriented. Well appearing and in no distress. Eyes: Conjunctivae are normal. Normal extraocular movements. Cardiovascular: Normal rate, regular rhythm. No murmurs, rubs, or gallops. Respiratory: Normal respiratory effort without tachypnea nor retractions. Breath sounds are clear and equal bilaterally. No wheezes/rales/rhonchi. Gastrointestinal: Soft and nontender. Normal bowel sounds Musculoskeletal: Nontender with normal range of motion in extremities. No lower extremity tenderness nor edema. Neurologic:  Normal speech and language. No gross focal neurologic deficits are appreciated.  Skin:  Skin is warm, dry and intact. No rash noted. Psychiatric:  Mood and affect are normal. Speech and behavior are normal.  ____________________________________________  ED COURSE:  As part of my medical decision making, I reviewed the following data within the [place] History obtained from family if available, nursing notes, old chart and ekg, as well as notes from prior ED visits. Patient presented for abdominal pain and nausea, we will assess with labs and imaging as indicated at this time.   Procedures  Emillia Weatherly was evaluated in Emergency Department on 03/01/2019 for the symptoms described in the history of present  illness. She was evaluated in the context of the global COVID-19 pandemic, which necessitated consideration that the patient might be at risk for infection with the SARS-CoV-2 virus that causes COVID-19. Institutional protocols and algorithms that pertain to the evaluation of patients at risk for COVID-19 are in a state of rapid change based on information released by regulatory bodies including the CDC and federal and state organizations. These policies and algorithms were followed during the patient's care in the ED.  ____________________________________________   LABS (pertinent positives/negatives)  Labs Reviewed  COMPREHENSIVE METABOLIC PANEL - Abnormal; Notable for the following components:      Result Value   AST 13 (*)    All other components within normal limits  URINALYSIS, COMPLETE (UACMP) WITH MICROSCOPIC - Abnormal; Notable for the following components:   Color, Urine YELLOW (*)    APPearance CLEAR (*)    Hgb urine dipstick MODERATE (*)    All other components within normal limits  CHLAMYDIA/NGC RT PCR ([facility] ONLY)  LIPASE, BLOOD  CBC  POC URINE PREG, ED  POCT PREGNANCY, URINE    RADIOLOGY  CT from 5 days ago IMPRESSION: Negative. No CT evidence for acute intra-abdominal or pelvic abnormality  ____________________________________________   DIFFERENTIAL DIAGNOSIS   Chronic pain, scar tissue formation, UTI, inguinal hernia, renal colic, chronic pain, IBS, PID  FINAL ASSESSMENT AND PLAN  Abdominal pain   Plan: The patient had presented for recurrence of her left lower quadrant pain. Patient's labs did not reveal any acute process. Patient's imaging was reassuring from several days ago.  No clear etiology for her symptoms, I will refer her to GI for close outpatient follow-up.   Laurence Aly, MD    Note: This note was generated in part or whole with voice recognition software. Voice recognition is usually quite accurate but there are  transcription errors that can and very often do occur. I apologize for any typographical errors that were not detected and corrected.     Earleen Newport, MD 03/01/19 1134

## 2019-03-01 NOTE — ED Notes (Signed)
ED Provider Williams at bedside. 

## 2019-03-07 ENCOUNTER — Telehealth: Payer: Self-pay | Admitting: Advanced Practice Midwife

## 2019-03-07 NOTE — Telephone Encounter (Signed)
Reached out to patient to reschedule ultrasound and follow up appointment.  She declines to schedule at this time.

## 2019-03-08 ENCOUNTER — Ambulatory Visit: Payer: No Typology Code available for payment source | Admitting: Psychology

## 2019-03-17 ENCOUNTER — Ambulatory Visit (INDEPENDENT_AMBULATORY_CARE_PROVIDER_SITE_OTHER): Payer: No Typology Code available for payment source | Admitting: Psychology

## 2019-03-17 DIAGNOSIS — F411 Generalized anxiety disorder: Secondary | ICD-10-CM | POA: Diagnosis not present

## 2019-03-23 ENCOUNTER — Ambulatory Visit: Payer: No Typology Code available for payment source | Admitting: Gastroenterology

## 2019-03-29 ENCOUNTER — Emergency Department: Payer: PRIVATE HEALTH INSURANCE

## 2019-03-29 ENCOUNTER — Emergency Department
Admission: EM | Admit: 2019-03-29 | Discharge: 2019-03-29 | Disposition: A | Discharge status: Home / Self Care | Payer: PRIVATE HEALTH INSURANCE | Attending: Emergency Medicine | Admitting: Emergency Medicine

## 2019-03-29 ENCOUNTER — Other Ambulatory Visit: Payer: Self-pay

## 2019-03-29 DIAGNOSIS — Z87891 Personal history of nicotine dependence: Secondary | ICD-10-CM | POA: Insufficient documentation

## 2019-03-29 DIAGNOSIS — Z20828 Contact with and (suspected) exposure to other viral communicable diseases: Secondary | ICD-10-CM | POA: Diagnosis not present

## 2019-03-29 DIAGNOSIS — R112 Nausea with vomiting, unspecified: Secondary | ICD-10-CM | POA: Insufficient documentation

## 2019-03-29 DIAGNOSIS — K529 Noninfective gastroenteritis and colitis, unspecified: Secondary | ICD-10-CM

## 2019-03-29 DIAGNOSIS — R1011 Right upper quadrant pain: Secondary | ICD-10-CM | POA: Diagnosis present

## 2019-03-29 DIAGNOSIS — R1084 Generalized abdominal pain: Secondary | ICD-10-CM | POA: Insufficient documentation

## 2019-03-29 LAB — URINE DRUG SCREEN, QUALITATIVE (ARMC ONLY)
Amphetamines, Ur Screen: NOT DETECTED
Barbiturates, Ur Screen: NOT DETECTED
Benzodiazepine, Ur Scrn: NOT DETECTED
Cannabinoid 50 Ng, Ur ~~LOC~~: POSITIVE — AB
Cocaine Metabolite,Ur ~~LOC~~: NOT DETECTED
MDMA (Ecstasy)Ur Screen: NOT DETECTED
Methadone Scn, Ur: NOT DETECTED
Opiate, Ur Screen: NOT DETECTED
Phencyclidine (PCP) Ur S: NOT DETECTED
Tricyclic, Ur Screen: NOT DETECTED

## 2019-03-29 LAB — COMPREHENSIVE METABOLIC PANEL
ALT: 9 U/L (ref 0–44)
AST: 17 U/L (ref 15–41)
Albumin: 4.4 g/dL (ref 3.5–5.0)
Alkaline Phosphatase: 64 U/L (ref 38–126)
Anion gap: 8 (ref 5–15)
BUN: 10 mg/dL (ref 6–20)
CO2: 22 mmol/L (ref 22–32)
Calcium: 9.1 mg/dL (ref 8.9–10.3)
Chloride: 107 mmol/L (ref 98–111)
Creatinine, Ser: 0.67 mg/dL (ref 0.44–1.00)
GFR calc Af Amer: >60 mL/min (ref >60)
GFR calc non Af Amer: >60 mL/min (ref >60)
Glucose, Bld: 96 mg/dL (ref 70–99)
Potassium: 3.3 mmol/L — ABNORMAL LOW (ref 3.5–5.1)
Sodium: 137 mmol/L (ref 135–145)
Total Bilirubin: 0.9 mg/dL (ref 0.3–1.2)
Total Protein: 7.5 g/dL (ref 6.5–8.1)

## 2019-03-29 LAB — CBC WITH DIFFERENTIAL/PLATELET
Abs Immature Granulocytes: 0.03 10*3/uL (ref 0.00–0.07)
Basophils Absolute: 0 10*3/uL (ref 0.0–0.1)
Basophils Relative: 0 %
Eosinophils Absolute: 0 10*3/uL (ref 0.0–0.5)
Eosinophils Relative: 0 %
HCT: 39.6 % (ref 36.0–46.0)
Hemoglobin: 13.3 g/dL (ref 12.0–15.0)
Immature Granulocytes: 0 %
Lymphocytes Relative: 22 %
Lymphs Abs: 2 10*3/uL (ref 0.7–4.0)
MCH: 29.3 pg (ref 26.0–34.0)
MCHC: 33.6 g/dL (ref 30.0–36.0)
MCV: 87.2 fL (ref 80.0–100.0)
Monocytes Absolute: 0.4 10*3/uL (ref 0.1–1.0)
Monocytes Relative: 5 %
Neutro Abs: 6.8 10*3/uL (ref 1.7–7.7)
Neutrophils Relative %: 73 %
Platelets: 333 10*3/uL (ref 150–400)
RBC: 4.54 MIL/uL (ref 3.87–5.11)
RDW: 12.2 % (ref 11.5–15.5)
WBC: 9.4 10*3/uL (ref 4.0–10.5)
nRBC: 0 % (ref 0.0–0.2)

## 2019-03-29 LAB — C DIFFICILE QUICK SCREEN W PCR REFLEX
C Diff antigen: NEGATIVE
C Diff interpretation: NOT DETECTED
C Diff toxin: NEGATIVE

## 2019-03-29 LAB — URINALYSIS, COMPLETE (UACMP) WITH MICROSCOPIC
Bacteria, UA: NONE SEEN
Bilirubin Urine: NEGATIVE
Glucose, UA: NEGATIVE mg/dL
Ketones, ur: 5 mg/dL — AB
Leukocytes,Ua: NEGATIVE
Nitrite: NEGATIVE
Protein, ur: NEGATIVE mg/dL
Specific Gravity, Urine: 1.01 (ref 1.005–1.030)
pH: 8 (ref 5.0–8.0)

## 2019-03-29 LAB — GASTROINTESTINAL PANEL BY PCR, STOOL (REPLACES STOOL CULTURE)

## 2019-03-29 LAB — LIPASE, BLOOD: Lipase: 24 U/L (ref 11–51)

## 2019-03-29 LAB — PREGNANCY, URINE: Preg Test, Ur: NEGATIVE

## 2019-03-29 LAB — SARS CORONAVIRUS 2 BY RT PCR (HOSPITAL ORDER, PERFORMED IN ~~LOC~~ HOSPITAL LAB): SARS Coronavirus 2: NEGATIVE

## 2019-03-29 MED ORDER — PROMETHAZINE HCL 25 MG/ML IJ SOLN
12.5000 mg | Freq: Once | INTRAMUSCULAR | Status: AC
  Administered 2019-03-29: 12.5 mg via INTRAVENOUS
  Filled 2019-03-29: qty 1

## 2019-03-29 MED ORDER — SODIUM CHLORIDE 0.9 % IV BOLUS
500.0000 mL | Freq: Once | INTRAVENOUS | Status: AC
  Administered 2019-03-29: 500 mL via INTRAVENOUS

## 2019-03-29 MED ORDER — ONDANSETRON HCL 4 MG/2ML IJ SOLN
4.0000 mg | Freq: Once | INTRAMUSCULAR | Status: AC
  Administered 2019-03-29: 4 mg via INTRAVENOUS
  Filled 2019-03-29: qty 2

## 2019-03-29 MED ORDER — FENTANYL CITRATE (PF) 100 MCG/2ML IJ SOLN
50.0000 ug | Freq: Once | INTRAMUSCULAR | Status: AC
  Administered 2019-03-29: 50 ug via INTRAVENOUS
  Filled 2019-03-29: qty 2

## 2019-03-29 MED ORDER — SODIUM CHLORIDE 0.9 % IV BOLUS
1000.0000 mL | Freq: Once | INTRAVENOUS | Status: AC
  Administered 2019-03-29: 1000 mL via INTRAVENOUS

## 2019-03-29 MED ORDER — IOHEXOL 240 MG/ML SOLN: 50.0000 mL | Freq: Once | INTRAMUSCULAR | Status: DC | PRN

## 2019-03-29 MED ORDER — ONDANSETRON HCL 4 MG PO TABS: 4.0000 mg | ORAL_TABLET | Freq: Every day | ORAL | 0 refills | Status: DC | PRN

## 2019-03-29 MED ORDER — IOHEXOL 300 MG/ML  SOLN
100.0000 mL | Freq: Once | INTRAMUSCULAR | Status: AC | PRN
  Administered 2019-03-29: 100 mL via INTRAVENOUS

## 2019-03-29 NOTE — ED Provider Notes (Signed)
[facility] Emergency Department Provider Note  ____________________________________________   I have reviewed the triage vital signs and the nursing notes. Where available I have reviewed prior notes and, if possible and indicated, outside hospital notes.   Patient seen and evaluated during the coronavirus epidemic during a time with low staffing  Patient seen for the symptoms described in the history of present illness. She was evaluated in the context of the global COVID-19 pandemic, which necessitated consideration that the patient might be at risk for infection with the SARS-CoV-2 virus that causes COVID-19. Institutional protocols and algorithms that pertain to the evaluation of patients at risk for COVID-19 are in a state of rapid change based on information released by regulatory bodies including the CDC and federal and state organizations. These policies and algorithms were followed during the patient's care in the ED.    HISTORY  Chief Complaint Abdominal Pain    HPI Tammy Bennett is a 38 y.o. female    patient with a history of irritable bowel syndrome reflux disease, gastritis, anxiety, states that she is been very anxious recently.  She is having diffuse abdominal discomfort, diarrhea and nausea.  Started on day.  No melena no bright red blood per rectum.  States the pain is more on the right than the left.  No fever.  No sick contacts with COVID.  No SI or HI but she states she is been very anxious about her job.  She states when she gets anxious her IBD does act up and she does have a long history of IBD on a nearly daily basis that seems to be somewhat worse but not entirely different.   Past Medical History:  Diagnosis Date  . Allergy   . Anemia    DURING PREGNANCY  . Anxiety   . Depression   . Gastritis   . GERD (gastroesophageal reflux disease)   . IBS (irritable bowel syndrome)   . Murmur   . SVT (supraventricular tachycardia) (HCC)   .  Tendonitis    ARM RIGHT    Patient Active Problem List   Diagnosis Date Noted  . Cyst of right nipple 01/06/2019  . 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 Severe anxiety with panic 10/13/2018    Past Surgical History:  Procedure Laterality Date  . catheter abaltion to heart     Treated SVT as teenager  . CHOLECYSTECTOMY    . EVACUATION BREAST HEMATOMA Right 01/12/2019   Procedure: RIGHT EXCISION NIPPLE ABSCESS;  Surgeon: Earline MayotteByrnett, Jeffrey W, MD;  Location: [facility] ORS;  Service: General;  Laterality: Right;  . TONSILLECTOMY    . TUBAL LIGATION      Prior to Admission medications   Medication Sig Start Date End Date Taking? Authorizing Provider  cetirizine (ZYRTEC) 10 MG tablet Take 1 tablet (10 mg total) by mouth daily. Patient taking differently: Take 10 mg by mouth every morning.  10/13/18   Galen ManilaKennedy, Lauren Renee, NP  fluticasone (FLONASE) 50 MCG/ACT nasal spray Place 2 sprays into both nostrils daily. 10/13/18   Galen ManilaKennedy, Lauren Renee, NP  hydrOXYzine (ATARAX/VISTARIL) 25 MG tablet Take 1 tablet (25 mg total) by mouth 2 (two) times daily as needed for anxiety. May take 1 additional pill per dose if acute panic attack. Max 3 pills in 24 hours 01/25/19   Karamalegos, Netta NeatAlexander J, DO  levonorgestrel (MIRENA, 52 MG,) 20 MCG/24HR IUD 1 each by Intrauterine route once.    [provider]  mirtazapine (REMERON) 7.5  MG tablet Take 1 tablet (7.5 mg total) by mouth at bedtime. 01/25/19   Karamalegos, Devonne Doughty, DO  oxyCODONE-acetaminophen (PERCOCET) 5-325 MG tablet Take 1 tablet by mouth every 8 (eight) hours as needed. Patient not taking: Reported on 03/23/2019 03/01/19   Earleen Newport, MD  sertraline (ZOLOFT) 50 MG tablet Take 1.5 tablets (75 mg total) by mouth daily. 01/25/19   Karamalegos, Devonne Doughty, DO    Allergies Bacitracin  Family History  Problem Relation Age of Onset  . Stroke Mother   . Stomach cancer Maternal Grandmother   . Leukemia Maternal  Grandfather   . Pancreatic cancer Paternal Grandmother   . Healthy Brother   . Sickle cell anemia Half-Sister   . Crohn's disease Half-Sister   . Ulcerative colitis Half-Sister   . Thyroid disease Cousin   . Breast cancer Maternal Aunt 48    Social History Social History   Tobacco Use  . Smoking status: Former Smoker    Types: Cigarettes  . Smokeless tobacco: Never Used  Substance Use Topics  . Alcohol use: Yes    Comment: OCC  . Drug use: Not Currently    Types: Marijuana    Comment: none in years    Review of Systems Constitutional: No fever/chills Eyes: No visual changes. ENT: No sore throat. No stiff neck no neck pain Cardiovascular: Denies chest pain. Respiratory: Denies shortness of breath. Gastrointestinal:   See HPI genitourinary: Negative for dysuria. Musculoskeletal: Negative lower extremity swelling Skin: Negative for rash. Neurological: Negative for severe headaches, focal weakness or numbness.   ____________________________________________   PHYSICAL EXAM:  VITAL SIGNS: ED Triage Vitals  Enc Vitals Group     BP 03/29/19 0845 (!) 139/100     Pulse Rate 03/29/19 0845 (!) 106     Resp 03/29/19 0845 18     Temp 03/29/19 0845 99.1 F (37.3 C)     Temp Source 03/29/19 0845 Oral     SpO2 03/29/19 0845 100 %     Weight 03/29/19 0846 170 lb (77.1 kg)     Height 03/29/19 0846 5\' 2"  (1.575 m)     Head Circumference --      Peak Flow --      Pain Score 03/29/19 0845 8     Pain Loc --      Pain Edu? --      Excl. in [place]? --     Constitutional: Alert and oriented. Well appearing and in no acute distress. Eyes: Conjunctivae are normal Head: Atraumatic HEENT: No congestion/rhinnorhea. Mucous membranes are moist.  Oropharynx non-erythematous Neck:   Nontender with no meningismus, no masses, no stridor Cardiovascular: Normal rate, regular rhythm. Grossly normal heart sounds.  Good peripheral circulation. Respiratory: Normal respiratory effort.  No  retractions. Lungs CTAB. Abdominal: Soft and mild diffuse tenderness. No distention. No guarding no rebound Back:  There is no focal tenderness or step off.  there is no midline tenderness there are no lesions noted. there is no CVA tenderness  Musculoskeletal: No lower extremity tenderness, no upper extremity tenderness. No joint effusions, no DVT signs strong distal pulses no edema Neurologic:  Normal speech and language. No gross focal neurologic deficits are appreciated.  Skin:  Skin is warm, dry and intact. No rash noted. Psychiatric: Mood and affect are anxious. Speech and behavior are anxious.  ____________________________________________   LABS (all labs ordered are listed, but only abnormal results are displayed)  Labs Reviewed  URINALYSIS, COMPLETE (UACMP) WITH MICROSCOPIC - Abnormal; Notable for  the following components:      Result Value   Color, Urine YELLOW (*)    APPearance HAZY (*)    Hgb urine dipstick MODERATE (*)    Ketones, ur 5 (*)    All other components within normal limits  URINE DRUG SCREEN, QUALITATIVE ([facility] ONLY) - Abnormal; Notable for the following components:   Cannabinoid 50 Ng, Ur [place] POSITIVE (*)    All other components within normal limits  COMPREHENSIVE METABOLIC PANEL - Abnormal; Notable for the following components:   Potassium 3.3 (*)    All other components within normal limits  SARS CORONAVIRUS 2 (HOSPITAL ORDER, PERFORMED IN [place] [facility])  GASTROINTESTINAL PANEL BY PCR, STOOL (REPLACES STOOL CULTURE)  C DIFFICILE QUICK SCREEN W PCR REFLEX  CBC WITH DIFFERENTIAL/PLATELET  LIPASE, BLOOD  PREGNANCY, URINE    Pertinent labs  results that were available during my care of the patient were reviewed by me and considered in my medical decision making (see chart for details). ____________________________________________  EKG  I personally interpreted any EKGs ordered by me or triage Sinus rhythm right bundle branch block no acute  ST elevation or depression no acute ischemia. ____________________________________________  RADIOLOGY  Pertinent labs & imaging results that were available during my care of the patient were reviewed by me and considered in my medical decision making (see chart for details). If possible, patient and/or family made aware of any abnormal findings.  No results found. ____________________________________________    PROCEDURES  Procedure(s) performed: None  Procedures  Critical Care performed: None  ____________________________________________   INITIAL IMPRESSION / ASSESSMENT AND PLAN / ED COURSE  Pertinent labs & imaging results that were available during my care of the patient were reviewed by me and considered in my medical decision making (see chart for details).   Nausea vomiting diarrhea and diffuse abdominal cramping which she states is more on the right than the left.  Abdomen is nonsurgical but given history we will obtain imaging, blood work, give her IV fluids and reassess.  This certainly could be an infectious diarrhea no recent antibiotics however anything to suggest C. difficile.  If she can give us a stool [place] we will check.  Nothing at this time to suggest strongly suggest appendicitis.  We are going to treat her symptomology and see what our results of her tests are.    ____________________________________________   FINAL CLINICAL IMPRESSION(S) / ED DIAGNOSES  Final diagnoses:  None      This chart was dictated using voice recognition software.  Despite best efforts to proofread,  errors can occur which can change meaning.      Jeanmarie PlantMcShane, Marlos Carmen A, MD 03/29/19 1113

## 2019-03-29 NOTE — ED Notes (Signed)

## 2019-03-29 NOTE — ED Notes (Signed)
Hat in toilet, aware of need for stool sample. Drinking oral CT contrast currently. Given warm blankets.

## 2019-03-29 NOTE — Discharge Instructions (Addendum)
Return to the emergency room for any new or worrisome symptoms going increased pain, fever, vomiting, any from your bottom or other concerns.  Drink plenty of fluids, follow closely your primary care doctor.

## 2019-03-29 NOTE — ED Triage Notes (Signed)
Pt c/o RUQ pain with N/V/D that started yesterday. States it feels like a gall bladder attack but states she had her gall bladder removed.

## 2019-04-01 ENCOUNTER — Ambulatory Visit (INDEPENDENT_AMBULATORY_CARE_PROVIDER_SITE_OTHER): Payer: No Typology Code available for payment source | Admitting: Psychology

## 2019-04-01 DIAGNOSIS — F418 Other specified anxiety disorders: Secondary | ICD-10-CM

## 2019-04-05 ENCOUNTER — Ambulatory Visit: Payer: No Typology Code available for payment source | Admitting: Gastroenterology

## 2019-04-16 ENCOUNTER — Ambulatory Visit (INDEPENDENT_AMBULATORY_CARE_PROVIDER_SITE_OTHER): Payer: No Typology Code available for payment source | Admitting: Psychology

## 2019-04-16 DIAGNOSIS — F411 Generalized anxiety disorder: Secondary | ICD-10-CM | POA: Diagnosis not present

## 2019-04-30 ENCOUNTER — Ambulatory Visit: Payer: No Typology Code available for payment source | Admitting: Psychology

## 2019-05-12 ENCOUNTER — Other Ambulatory Visit: Payer: Self-pay

## 2019-05-12 ENCOUNTER — Ambulatory Visit (INDEPENDENT_AMBULATORY_CARE_PROVIDER_SITE_OTHER): Payer: No Typology Code available for payment source | Admitting: Gastroenterology

## 2019-05-12 ENCOUNTER — Encounter: Payer: Self-pay | Admitting: Gastroenterology

## 2019-05-12 VITALS — BP 110/74 | HR 95 | Temp 98.5°F | Ht 65.0 in | Wt 166.6 lb

## 2019-05-12 DIAGNOSIS — K529 Noninfective gastroenteritis and colitis, unspecified: Secondary | ICD-10-CM

## 2019-05-12 DIAGNOSIS — R140 Abdominal distension (gaseous): Secondary | ICD-10-CM

## 2019-05-12 DIAGNOSIS — R933 Abnormal findings on diagnostic imaging of other parts of digestive tract: Secondary | ICD-10-CM | POA: Diagnosis not present

## 2019-05-12 MED ORDER — NA SULFATE-K SULFATE-MG SULF 17.5-3.13-1.6 GM/177ML PO SOLN: 354.0000 mL | Freq: Once | ORAL | 0 refills | Status: AC

## 2019-05-12 MED ORDER — DICYCLOMINE HCL 10 MG PO CAPS: 10.0000 mg | ORAL_CAPSULE | Freq: Three times a day (TID) | ORAL | 0 refills | Status: DC

## 2019-05-12 NOTE — Patient Instructions (Signed)
Low-Fiber Eating Plan Fiber is found in fruits, vegetables, whole grains, and beans. Eating a diet low in fiber helps to reduce how often you have bowel movements and how much you produce during a bowel movement. A low-fiber eating plan may help your digestive system heal if:  You have certain conditions, such as Crohn's disease or diverticulitis.  You recently had radiation therapy on your pelvis or bowel.  You recently had intestinal surgery.  You have a new surgical opening in your abdomen (colostomy or ileostomy).  Your intestine is narrowed (stricture). Your health care provider will determine how long you need to stay on this diet. Your health care provider may recommend that you work with a diet and nutrition specialist (dietitian). What are tips for following this plan? General guidelines  Follow recommendations from your dietitian about how much fiber you should have each day.  Most people on this eating plan should try to eat less than 10 grams (g) of fiber each day. Your daily fiber goal is _________________ g.  Take vitamin and mineral supplements as told by your health care provider or dietitian. Chewable or liquid forms are best when on this eating plan. Reading food labels  Check food labels for the amount of dietary fiber.  Choose foods that have less than 2 grams of fiber in one serving. Cooking  Use white flour and other allowed grains for baking and cooking.  Cook meat using methods that keep it tender, such as braising or poaching.  Cook eggs until the yolk is completely solid.  Cook with healthy oils, such as olive oil or canola oil. Meal planning   Eat 5-6 small meals throughout the day instead of 3 large meals.  If you are lactose intolerant: ? Choose low-lactose dairy foods. ? Do not eat dairy foods, if told by your dietitian.  Limit fat and oils to less than 8 teaspoons a day.  Eat small portions of desserts. What foods are allowed? The items  listed below may not be a complete list. Talk with your dietitian about what dietary choices are best for you. Grains All bread and crackers made with white flour. Waffles, pancakes, and French toast. Bagels. Pretzels. Melba toast, zwieback, and matzoh. Cooked and dried cereals that do not contain whole grains, added fiber, seeds, or dried fruit. Cornmeal. [place]. Hot and cold cereals made with refined corn, wheat, rice, or oats. Plain pasta and noodles. White rice. Vegetables Well-cooked or canned vegetables without skin, seeds, or stems. Cooked potatoes without skins. Vegetable juice. Fruits Soft-cooked or canned fruits without skin and seeds. Peeled ripe banana. Applesauce. Fruit juice without pulp. Meats and other protein foods Ground meat. Tender cuts of meat or poultry. Eggs. Fish, seafood, and shellfish. Smooth nut butters. Tofu. Dairy All milk products and drinks. Lactose-free milks, including rice, soy, and almond milks. Yogurt without fruit, nuts, chocolate, or granola mix-ins. Sour cream. Cottage cheese. Cheese. Beverages Decaf coffee. Fruit and vegetable juices or smoothies (in small amounts, with no pulp or skins, and with fruits from allowed list). Sports drinks. Herbal tea. Fats and oils Olive oil, canola oil, sunflower oil, flaxseed oil, and grapeseed oil. Mayonnaise. Cream cheese. Margarine. Butter. Sweets and desserts Plain cakes and cookies. Cream pies and pies made with allowed fruits. Pudding. Custard. Fruit gelatin. Sherbet. Popsicles. Ice cream without nuts. Plain hard candy. Honey. Jelly. Molasses. Syrups, including chocolate syrup. Chocolate. Marshmallows. Gumdrops. Seasoning and other foods Bouillon. Broth. Cream soups made from allowed foods. Strained soup. Casseroles made   with allowed foods. Ketchup. Mild mustard. Mild salad dressings. Plain gravies. Vinegar. Spices in moderation. Salt. Sugar. What foods are not allowed? The items listed below may not be a complete  list. Talk with your dietitian about what dietary choices are best for you. Grains Whole wheat and whole grain breads and crackers. Multigrain breads and crackers. Rye bread. Whole grain or multigrain cereals. Cereals with nuts, raisins, or coconut. Bran. Coarse wheat cereals. Granola. High-fiber cereals. Cornmeal or corn bread. Whole grain pasta. Wild or brown rice. Quinoa. Popcorn. Buckwheat. Wheat germ. Vegetables Potato skins. Raw or undercooked vegetables. All beans and bean sprouts. Cooked greens. Corn. Peas. Cabbage. Beets. Broccoli. Brussels sprouts. Cauliflower. Mushrooms. Onions. Peppers. Parsnips. Okra. Sauerkraut. Fruit Raw or dried fruit. Berries. Fruit juice with pulp. Prune juice. Meats and other protein foods Tough, fibrous meats with gristle. Fatty meat. Poultry with skin. Fried meat, poultry, or fish. Deli or lunch meats. Sausage, bacon, and hot dogs. Nuts and chunky nut butter. Dried peas, beans, and lentils. Dairy Yogurt with fruit, nuts, chocolate, or granola mix-ins. Beverages Caffeinated coffee and teas. Fats and oils Avocado. Coconut. Sweets and desserts Desserts, cookies, or candies that contain nuts or coconut. Dried fruit. Jams and preserves with seeds. Marmalade. Any dessert made with fruits or grains that are not allowed. Seasoning and other foods Corn tortilla chips. Soups made with vegetables or grains that are not allowed. Relish. Horseradish. Pickles. Olives. Summary  Most people on a low-fiber eating plan should eat less than 10 grams of fiber a day. Follow recommendations from your dietitian about how much fiber you should have each day.  Always check food labels to see the dietary fiber content of packaged foods. In general, a low-fiber food will have fewer than 2 grams of fiber per serving.  In general, try to avoid whole grains, raw fruits and vegetables, dried fruit, tough cuts of meat, nuts, and seeds.  Take a vitamin and mineral supplement as told  by your health care provider or dietitian. This information is not intended to replace advice given to you by your health care provider. Make sure you discuss any questions you have with your health care provider. Document Released: 01/17/2002 Document Revised: 11/19/2018 Document Reviewed: 09/30/2016 Elsevier Patient Education  2020 Elsevier Inc.  

## 2019-05-12 NOTE — Progress Notes (Signed)
[name] MD, MRCP(U.K) [address]  [address]  [place], [place] [address]  Main: [phone]  Fax: [phone]   Primary Care Physician: Galen Manila, NP  Primary Gastroenterologist:  Dr. [name]   Chief Complaint  Patient presents with  . ER Follow up    HPI: Tammy Bennett is a 38 y.o. female has been referred to see me from her emergency room.  She presented to the ER on 03/29/2019 with abdominal pain.  Prior history of irritable bowel syndrome.  A CT scan of the abdomen was performed on 03/29/2019 and showed new wall thickening and pericolonic inflammatory changes in the cecum and ascending colon consistent with colitis.  Normal-appearing appendix.  03/29/2019: Stool for C. difficile, GI PCR was negative. Urine was positive for cannabinoids, moderate to large quantity of blood has been seen in the urine on multiple checks.  Hemoglobin 13.3 g with an MCV of 87.2. A CT scan on 02/24/2019 showed no intra-abdominal abnormality.  Similarly CT scan of the abdomen renal stone protocol in October 2019 showed no abnormalities  She says that she has had right upper quadrant pain and lower abdominal pain for many years and has been attributed to irritable bowel.  She says the stress makes it worse.  The pain is usually relieved with a bowel movement.  She has been having multiple bowel movements a day for a few months.  Denies any blood in the stool.  She smokes marijuana.  Denies any NSAID use.  Denies any joint pain or skin rashes.  She does have a sister who suffers from inflammatory bowel disease particularly Crohn's disease.  They have a common father but a different mother.  She also complains of gas and bloating and does consume a lot of ginger ale.  Current Outpatient Medications  Medication Sig Dispense Refill  . cetirizine (ZYRTEC) 10 MG tablet Take 1 tablet (10 mg total) by mouth daily. (Patient taking differently: Take 10 mg by mouth every morning. )    .  fluticasone (FLONASE) 50 MCG/ACT nasal spray Place 2 sprays into both nostrils daily. 16 g 6  . hydrOXYzine (ATARAX/VISTARIL) 25 MG tablet Take 1 tablet (25 mg total) by mouth 2 (two) times daily as needed for anxiety. May take 1 additional pill per dose if acute panic attack. Max 3 pills in 24 hours 30 tablet 2  . levonorgestrel (MIRENA, 52 MG,) 20 MCG/24HR IUD 1 each by Intrauterine route once.    . mirtazapine (REMERON) 7.5 MG tablet Take 1 tablet (7.5 mg total) by mouth at bedtime. 30 tablet 2  . ondansetron (ZOFRAN) 4 MG tablet Take 1 tablet (4 mg total) by mouth daily as needed for nausea or vomiting. 10 tablet 0  . sertraline (ZOLOFT) 50 MG tablet Take 1.5 tablets (75 mg total) by mouth daily. 45 tablet 2  . oxyCODONE-acetaminophen (PERCOCET) 5-325 MG tablet Take 1 tablet by mouth every 8 (eight) hours as needed. (Patient not taking: Reported on 03/23/2019) 20 tablet 0   No current facility-administered medications for this visit.     Allergies as of 05/12/2019 - Review Complete 05/12/2019  Allergen Reaction Noted  . Bacitracin Rash 01/12/2019    ROS:  General: Negative for anorexia, weight loss, fever, chills, fatigue, weakness. ENT: Negative for hoarseness, difficulty swallowing , nasal congestion. CV: Negative for chest pain, angina, palpitations, dyspnea on exertion, peripheral edema.  Respiratory: Negative for dyspnea at rest, dyspnea on exertion, cough, sputum, wheezing.  GI: See history of  present illness. GU:  Negative for dysuria, hematuria, urinary incontinence, urinary frequency, nocturnal urination.  Endo: Negative for unusual weight change.    Physical Examination:   BP 110/74   Pulse 95   Temp 98.5 F (36.9 C) (Oral)   Ht 5\' 5"  (1.651 m)   Wt 166 lb 9.6 oz (75.6 kg)   BMI 27.72 kg/m   General: Well-nourished, well-developed in no acute distress.  Eyes: No icterus. Conjunctivae pink. Mouth: Oropharyngeal mucosa moist and pink , no lesions erythema or exudate.  Lungs: Clear to auscultation bilaterally. Non-labored. Heart: Regular rate and rhythm, no murmurs rubs or gallops.  Abdomen: Bowel sounds are normal, nontender, nondistended, no hepatosplenomegaly or masses, no abdominal bruits or hernia , no rebound or guarding.   Extremities: No lower extremity edema. No clubbing or deformities. Neuro: Alert and oriented x 3.  Grossly intact. Skin: Warm and dry, no jaundice.   Psych: Alert and cooperative, normal mood and affect.   Imaging Studies: No results found.  Assessment and Plan:   Tammy Bennett is a 38 y.o. y/o female is here today to see me as a ER follow-up.  She presented to the ER recently with abdominal pain and was noted to have colitis in the right side of her colon.  She has been labeled to have irritable bowel syndrome for many years.  She has a family history of Crohn's disease.  She denies any NSAID use.  Consumes 3 drinks and does have a significant amount of gas and bloating.  Her abdominal cramping does get relieved with a bowel movement which fits the profile of irritable bowel syndrome.  She also does have risk factors for inflammatory bowel disease and radiological evidence of colitis which could be from inflammatory bowel disease or from a viral colitis.   1.  Colonoscopy with biopsies to rule out Crohn's disease 2.  Stop all sweet drinks to determine if high fructose corn syrup is causing the gas and bloating.  Consider a low FODMAP diet patient information be provided. 3.  For her abdominal cramping we will commence her on dicyclomine 10 mg up to 4 times a day as needed for pain.  In addition will be given some samples of IBgard. 4.  Celiac serology  I have discussed alternative options, risks & benefits,  which include, but are not limited to, bleeding, infection, perforation,respiratory complication & drug reaction.  The patient agrees with this plan & written consent will be obtained.      Dr Jonathon Bellows  MD,MRCP [facility]  Follow up in 6 to 8 weeks

## 2019-05-14 LAB — CELIAC DISEASE AB SCREEN W/RFX
Antigliadin Abs, IgA: 6 units (ref 0–19)
IgA/Immunoglobulin A, Serum: 205 mg/dL (ref 87–352)
Transglutaminase IgA: <2 U/mL (ref 0–3)

## 2019-05-15 ENCOUNTER — Encounter: Payer: Self-pay | Admitting: Gastroenterology

## 2019-05-26 ENCOUNTER — Other Ambulatory Visit
Admission: RE | Admit: 2019-05-26 | Discharge: 2019-05-26 | Disposition: A | Discharge status: Home / Self Care | Payer: No Typology Code available for payment source | Source: Ambulatory Visit | Attending: Gastroenterology | Admitting: Gastroenterology

## 2019-05-26 ENCOUNTER — Other Ambulatory Visit: Payer: Self-pay

## 2019-05-26 DIAGNOSIS — Z01812 Encounter for preprocedural laboratory examination: Secondary | ICD-10-CM | POA: Insufficient documentation

## 2019-05-26 DIAGNOSIS — Z20828 Contact with and (suspected) exposure to other viral communicable diseases: Secondary | ICD-10-CM | POA: Diagnosis not present

## 2019-05-27 LAB — SARS CORONAVIRUS 2 (TAT 6-24 HRS): SARS Coronavirus 2: NEGATIVE

## 2019-05-30 ENCOUNTER — Encounter
Admission: RE | Disposition: A | Discharge status: Home / Self Care | Payer: Self-pay | Source: Home / Self Care | Attending: Gastroenterology

## 2019-05-30 ENCOUNTER — Ambulatory Visit: Payer: PRIVATE HEALTH INSURANCE | Admitting: Anesthesiology

## 2019-05-30 ENCOUNTER — Encounter: Payer: Self-pay | Admitting: *Deleted

## 2019-05-30 ENCOUNTER — Other Ambulatory Visit: Payer: Self-pay

## 2019-05-30 ENCOUNTER — Ambulatory Visit
Admission: RE | Admit: 2019-05-30 | Discharge: 2019-05-30 | Disposition: A | Discharge status: Home / Self Care | Payer: PRIVATE HEALTH INSURANCE | Attending: Gastroenterology | Admitting: Gastroenterology

## 2019-05-30 DIAGNOSIS — Z8719 Personal history of other diseases of the digestive system: Secondary | ICD-10-CM | POA: Diagnosis not present

## 2019-05-30 DIAGNOSIS — R933 Abnormal findings on diagnostic imaging of other parts of digestive tract: Secondary | ICD-10-CM | POA: Diagnosis not present

## 2019-05-30 DIAGNOSIS — Z975 Presence of (intrauterine) contraceptive device: Secondary | ICD-10-CM | POA: Diagnosis not present

## 2019-05-30 DIAGNOSIS — F329 Major depressive disorder, single episode, unspecified: Secondary | ICD-10-CM | POA: Insufficient documentation

## 2019-05-30 DIAGNOSIS — Z881 Allergy status to other antibiotic agents status: Secondary | ICD-10-CM | POA: Diagnosis not present

## 2019-05-30 DIAGNOSIS — Z793 Long term (current) use of hormonal contraceptives: Secondary | ICD-10-CM | POA: Diagnosis not present

## 2019-05-30 DIAGNOSIS — K589 Irritable bowel syndrome without diarrhea: Secondary | ICD-10-CM | POA: Diagnosis not present

## 2019-05-30 DIAGNOSIS — Z87891 Personal history of nicotine dependence: Secondary | ICD-10-CM | POA: Insufficient documentation

## 2019-05-30 DIAGNOSIS — Z79899 Other long term (current) drug therapy: Secondary | ICD-10-CM | POA: Insufficient documentation

## 2019-05-30 DIAGNOSIS — F419 Anxiety disorder, unspecified: Secondary | ICD-10-CM | POA: Diagnosis not present

## 2019-05-30 DIAGNOSIS — K529 Noninfective gastroenteritis and colitis, unspecified: Secondary | ICD-10-CM

## 2019-05-30 DIAGNOSIS — K6289 Other specified diseases of anus and rectum: Secondary | ICD-10-CM | POA: Insufficient documentation

## 2019-05-30 DIAGNOSIS — R599 Enlarged lymph nodes, unspecified: Secondary | ICD-10-CM | POA: Diagnosis not present

## 2019-05-30 HISTORY — PX: COLONOSCOPY WITH PROPOFOL: SHX5780

## 2019-05-30 LAB — POCT PREGNANCY, URINE: Preg Test, Ur: NEGATIVE

## 2019-05-30 SURGERY — COLONOSCOPY WITH PROPOFOL
Anesthesia: General

## 2019-05-30 MED ORDER — PROPOFOL 500 MG/50ML IV EMUL
INTRAVENOUS | Status: AC
  Filled 2019-05-30: qty 50

## 2019-05-30 MED ORDER — PROPOFOL 10 MG/ML IV BOLUS
INTRAVENOUS | Status: DC | PRN
  Administered 2019-05-30: 50 mg via INTRAVENOUS
  Administered 2019-05-30: 60 mg via INTRAVENOUS
  Administered 2019-05-30: 40 mg via INTRAVENOUS

## 2019-05-30 MED ORDER — SODIUM CHLORIDE 0.9 % IV SOLN
INTRAVENOUS | Status: DC
  Administered 2019-05-30: 08:00:00 via INTRAVENOUS

## 2019-05-30 MED ORDER — PROPOFOL 500 MG/50ML IV EMUL
INTRAVENOUS | Status: DC | PRN
  Administered 2019-05-30: 175 ug/kg/min via INTRAVENOUS

## 2019-05-30 MED ORDER — LIDOCAINE HCL (CARDIAC) PF 100 MG/5ML IV SOSY
PREFILLED_SYRINGE | INTRAVENOUS | Status: DC | PRN
  Administered 2019-05-30: 50 mg via INTRAVENOUS

## 2019-05-30 NOTE — Anesthesia Post-op Follow-up Note (Signed)
Anesthesia QCDR form completed.        

## 2019-05-30 NOTE — Transfer of Care (Signed)
Immediate Anesthesia Transfer of Care Note  Patient: Tammy Bennett  Procedure(s) Performed: COLONOSCOPY WITH PROPOFOL (N/A )  Patient Location: PACU  Anesthesia Type:General  Level of Consciousness: awake and alert   Airway & Oxygen Therapy: Patient Spontanous Breathing  Post-op Assessment: Report given to RN and Post -op Vital signs reviewed and stable  Post vital signs: Reviewed and stable  Last Vitals:  Vitals Value Taken Time  BP 155/82 05/30/19 0912  Temp 36.2 C 05/30/19 0910  Pulse 84 05/30/19 0912  Resp 15 05/30/19 0912  SpO2 100 % 05/30/19 0912  Vitals shown include unvalidated device data.  Last Pain:  Vitals:   05/30/19 0912  TempSrc:   PainSc: 0-No pain         Complications: No apparent anesthesia complications

## 2019-05-30 NOTE — Op Note (Signed)
[facility] Gastroenterology Patient Name: Tammy QuailsMelissa Bennett Procedure Date: 05/30/2019 8:46 AM MRN: [id number] Account #: [id number] Date of Birth: 02/17/1981 Admit Type: Outpatient Age: [age] [facility] 4 Gender: Female Note Status: Finalized Procedure:            Colonoscopy Indications:          Abnormal CT of the GI tract Providers:            Wyline MoodKiran Kaylia Winborne MD, MD Referring MD:         Galen ManilaLauren Renee Kennedy (Referring MD) Medicines:            Monitored Anesthesia Care Complications:        No immediate complications. Procedure:            Pre-Anesthesia Assessment:                       - Prior to the procedure, a History and Physical was                        performed, and patient medications, allergies and                        sensitivities were reviewed. The patient's tolerance of                        previous anesthesia was reviewed.                       - The risks and benefits of the procedure and the                        sedation options and risks were discussed with the                        patient. All questions were answered and informed                        consent was obtained.                       - ASA Grade Assessment: II - A patient with mild                        systemic disease.                       After obtaining informed consent, the colonoscope was                        passed under direct vision. Throughout the procedure,                        the patient's blood pressure, pulse, and oxygen                        saturations were monitored continuously. The                        Colonoscope was introduced through the anus and  advanced to the the terminal ileum. The colonoscopy was                        performed with ease. The patient tolerated the                        procedure well. The quality of the bowel preparation                        was excellent. Findings:      The perianal  and digital rectal examinations were normal.      Localized mild inflammation characterized by congestion (edema) and       erythema was found in the rectum. Biopsies were taken with a cold       forceps for histology.      The terminal ileum appeared normal. This was biopsied with a cold       forceps for histology.      The exam was otherwise without abnormality on direct and retroflexion       views. Impression:           - Localized mild inflammation was found in the rectum                        secondary to colitis. Biopsied.                       - The examined portion of the ileum was normal.                        Biopsied.                       - The examination was otherwise normal on direct and                        retroflexion views. Recommendation:       - Discharge patient to home (with escort).                       - Resume previous diet.                       - Continue present medications.                       - Await pathology results.                       - Return to my office as previously scheduled. Procedure Code(s):    --- Professional ---                       [phone], Colonoscopy, flexible; with biopsy, single or                        multiple Diagnosis Code(s):    --- Professional ---                       K52.9, Noninfective gastroenteritis and colitis,                        unspecified  R93.3, Abnormal findings on diagnostic imaging of other                        parts of digestive tract CPT copyright 2019 American Medical Association. All rights reserved. The codes documented in this report are preliminary and upon coder review may  be revised to meet current compliance requirements. [name], MD [name] MD, MD 05/30/2019 9:08:46 AM This report has been signed electronically. Number of Addenda: 0 Note Initiated On: 05/30/2019 8:46 AM Scope Withdrawal Time: 0 hours 10 minutes 29 seconds  Total Procedure Duration: 0  hours 13 minutes 47 seconds  Estimated Blood Loss: Estimated blood loss: none.      [facility]

## 2019-05-30 NOTE — Anesthesia Preprocedure Evaluation (Signed)
Anesthesia Evaluation  Patient identified by MRN, date of birth, ID band Patient awake    Reviewed: Allergy & Precautions, H&P , NPO status , Patient's Chart, lab work & pertinent test results, reviewed documented beta blocker date and time   Airway Mallampati: II   Neck ROM: full    Dental  (+) Poor Dentition   Pulmonary neg pulmonary ROS, former smoker,    Pulmonary exam normal        Cardiovascular Exercise Tolerance: Good negative cardio ROS Normal cardiovascular exam+ Valvular Problems/Murmurs  Rhythm:regular Rate:Normal     Neuro/Psych PSYCHIATRIC DISORDERS Anxiety Depression negative neurological ROS  negative psych ROS   GI/Hepatic negative GI ROS, Neg liver ROS, GERD  Medicated,  Endo/Other  negative endocrine ROS  Renal/GU negative Renal ROS  negative genitourinary   Musculoskeletal   Abdominal   Peds  Hematology negative hematology ROS (+) Blood dyscrasia, anemia ,   Anesthesia Other Findings Past Medical History: No date: Allergy No date: Anemia     Comment:  DURING PREGNANCY No date: Anxiety No date: Depression No date: Gastritis No date: GERD (gastroesophageal reflux disease) No date: IBS (irritable bowel syndrome) No date: Murmur No date: SVT (supraventricular tachycardia) (HCC) No date: Tendonitis     Comment:  ARM RIGHT Past Surgical History: No date: catheter abaltion to heart     Comment:  Treated SVT as teenager No date: CHOLECYSTECTOMY 01/12/2019: EVACUATION BREAST HEMATOMA; Right     Comment:  Procedure: RIGHT EXCISION NIPPLE ABSCESS;  Surgeon:               Robert Bellow, MD;  Location: [facility] ORS;  Service:               General;  Laterality: Right; No date: TONSILLECTOMY No date: TUBAL LIGATION   Reproductive/Obstetrics negative OB ROS                             Anesthesia Physical Anesthesia Plan  ASA: III  Anesthesia Plan: General   Post-op  Pain Management:    Induction:   PONV Risk Score and Plan:   Airway Management Planned:   Additional Equipment:   Intra-op Plan:   Post-operative Plan:   Informed Consent: I have reviewed the patients History and Physical, chart, labs and discussed the procedure including the risks, benefits and alternatives for the proposed anesthesia with the patient or authorized representative who has indicated his/her understanding and acceptance.     Dental Advisory Given  Plan Discussed with: CRNA  Anesthesia Plan Comments:         Anesthesia Quick Evaluation

## 2019-05-30 NOTE — H&P (Signed)
[name], MD [address], [place], [place], [address] [address], [address], [place], [place], [address] Phone: [phone]  Fax: [phone]  Primary Care Physician:  Galen Manila, NP   Pre-Procedure History & Physical: HPI:  Tammy Bennett is a 38 y.o. female is here for an colonoscopy.   Past Medical History:  Diagnosis Date  . Allergy   . Anemia    DURING PREGNANCY  . Anxiety   . Depression   . Gastritis   . GERD (gastroesophageal reflux disease)   . IBS (irritable bowel syndrome)   . Murmur   . SVT (supraventricular tachycardia) (HCC)   . Tendonitis    ARM RIGHT    Past Surgical History:  Procedure Laterality Date  . catheter abaltion to heart     Treated SVT as teenager  . CHOLECYSTECTOMY    . EVACUATION BREAST HEMATOMA Right 01/12/2019   Procedure: RIGHT EXCISION NIPPLE ABSCESS;  Surgeon: Earline Mayotte, MD;  Location: [facility] ORS;  Service: General;  Laterality: Right;  . TONSILLECTOMY    . TUBAL LIGATION      Prior to Admission medications   Medication Sig Start Date End Date Taking? Authorizing Provider  cetirizine (ZYRTEC) 10 MG tablet Take 1 tablet (10 mg total) by mouth daily. Patient taking differently: Take 10 mg by mouth every morning.  10/13/18  Yes Galen Manila, NP  dicyclomine (BENTYL) 10 MG capsule Take 1 capsule (10 mg total) by mouth 4 (four) times daily -  before meals and at bedtime. 05/12/19  Yes [name], MD  fluticasone [facility]) 50 MCG/ACT nasal spray Place 2 sprays into both nostrils daily. 10/13/18  Yes Galen Manila, NP  hydrOXYzine (ATARAX/VISTARIL) 25 MG tablet Take 1 tablet (25 mg total) by mouth 2 (two) times daily as needed for anxiety. May take 1 additional pill per dose if acute panic attack. Max 3 pills in 24 hours 01/25/19  Yes Karamalegos, Netta Neat, DO  levonorgestrel (MIRENA, 52 MG,) 20 MCG/24HR IUD 1 each by Intrauterine route once.   Yes [provider]   mirtazapine (REMERON) 7.5 MG tablet Take 1 tablet (7.5 mg total) by mouth at bedtime. 01/25/19  Yes Karamalegos, Netta Neat, DO  ondansetron (ZOFRAN) 4 MG tablet Take 1 tablet (4 mg total) by mouth daily as needed for nausea or vomiting. 03/29/19  Yes McShane, Rudy Jew, MD  oxyCODONE-acetaminophen (PERCOCET) 5-325 MG tablet Take 1 tablet by mouth every 8 (eight) hours as needed. 03/01/19  Yes Emily Filbert, MD  sertraline (ZOLOFT) 50 MG tablet Take 1.5 tablets (75 mg total) by mouth daily. 01/25/19  Yes Smitty Cords, DO    Allergies as of 05/12/2019 - Review Complete 05/12/2019  Allergen Reaction Noted  . Bacitracin Rash 01/12/2019    Family History  Problem Relation Age of Onset  . Stroke Mother   . Stomach cancer Maternal Grandmother   . Leukemia Maternal Grandfather   . Pancreatic cancer Paternal Grandmother   . Healthy Brother   . Sickle cell anemia Half-Sister   . Crohn's disease Half-Sister   . Ulcerative colitis Half-Sister   . Thyroid disease Cousin   . Breast cancer Maternal Aunt 22    Social History   Socioeconomic History  . Marital status: Married    Spouse name: Not on file  . Number of children: Not on file  . Years of education: Not on file  . Highest education  level: Not on file  Occupational History  . Not on file  Social Needs  . Financial resource strain: Not on file  . Food insecurity    Worry: Not on file    Inability: Not on file  . Transportation needs    Medical: Not on file    Non-medical: Not on file  Tobacco Use  . Smoking status: Never Smoker  . Smokeless tobacco: Never Used  Substance and Sexual Activity  . Alcohol use: Yes    Comment: OCC  . Drug use: Not Currently    Types: Marijuana    Comment: none in years  . Sexual activity: Yes    Birth control/protection: Surgical, I.U.D.    Comment: Tubal/ IUD  Lifestyle  . Physical activity    Days per week: Not on file    Minutes per session: Not on file  . Stress: Not  on file  Relationships  . Social Herbalist on phone: Not on file    Gets together: Not on file    Attends religious service: Not on file    Active member of club or organization: Not on file    Attends meetings of clubs or organizations: Not on file    Relationship status: Not on file  . Intimate partner violence    Fear of current or ex partner: Not on file    Emotionally abused: Not on file    Physically abused: Not on file    Forced sexual activity: Not on file  Other Topics Concern  . Not on file  Social History Narrative   ** Merged History Encounter **        Review of Systems: See HPI, otherwise negative ROS  Physical Exam: BP 120/81   Pulse 92   Temp (!) 97.5 F (36.4 C) (Tympanic)   Resp 16   Ht 5\' 5"  (1.651 m)   Wt 75 kg   SpO2 100%   BMI 27.51 kg/m  General:   Alert,  pleasant and cooperative in NAD Head:  Normocephalic and atraumatic. Neck:  Supple; no masses or thyromegaly. Lungs:  Clear throughout to auscultation, normal respiratory effort.    Heart:  +S1, +S2, Regular rate and rhythm, No edema. Abdomen:  Soft, nontender and nondistended. Normal bowel sounds, without guarding, and without rebound.   Neurologic:  Alert and  oriented x4;  grossly normal neurologically.  Impression/Plan: Tammy Bennett is here for an colonoscopy to be performed for abnormal ct scan of abdomen . Risks, benefits, limitations, and alternatives regarding  colonoscopy have been reviewed with the patient.  Questions have been answered.  All parties agreeable.   Jonathon Bellows, MD  05/30/2019, 8:41 AM

## 2019-05-30 NOTE — Anesthesia Postprocedure Evaluation (Signed)
Anesthesia Post Note  Patient: Surveyor, minerals  Procedure(s) Performed: COLONOSCOPY WITH PROPOFOL (N/A )  Patient location during evaluation: PACU Anesthesia Type: General Level of consciousness: awake and alert Pain management: pain level controlled Vital Signs Assessment: post-procedure vital signs reviewed and stable Respiratory status: spontaneous breathing, nonlabored ventilation, respiratory function stable and patient connected to nasal cannula oxygen Cardiovascular status: blood pressure returned to baseline and stable Postop Assessment: no apparent nausea or vomiting Anesthetic complications: no     Last Vitals:  Vitals:   05/30/19 0912 05/30/19 0922  BP: (!) 155/82 124/82  Pulse:    Resp:    Temp:    SpO2:      Last Pain:  Vitals:   05/30/19 0922  TempSrc:   PainSc: 0-No pain                 Molli Barrows

## 2019-05-30 NOTE — Anesthesia Procedure Notes (Signed)
Date/Time: 05/30/2019 8:50 AM Performed by: Johnna Acosta, CRNA Pre-anesthesia Checklist: Patient identified, Emergency Drugs available, Suction available, Patient being monitored and Timeout performed Patient Re-evaluated:Patient Re-evaluated prior to induction Oxygen Delivery Method: Nasal cannula Preoxygenation: Pre-oxygenation with 100% oxygen Induction Type: IV induction

## 2019-05-31 LAB — SURGICAL PATHOLOGY

## 2019-06-05 ENCOUNTER — Encounter: Payer: Self-pay | Admitting: Gastroenterology

## 2019-06-28 ENCOUNTER — Ambulatory Visit (INDEPENDENT_AMBULATORY_CARE_PROVIDER_SITE_OTHER): Payer: No Typology Code available for payment source | Admitting: Gastroenterology

## 2019-06-28 DIAGNOSIS — R140 Abdominal distension (gaseous): Secondary | ICD-10-CM | POA: Diagnosis not present

## 2019-06-28 DIAGNOSIS — K529 Noninfective gastroenteritis and colitis, unspecified: Secondary | ICD-10-CM | POA: Diagnosis not present

## 2019-06-28 NOTE — Progress Notes (Signed)
Tammy Bennett , MD [address]  [address]  [place], [place] [address]  Main: [phone]  Fax: [phone]   Primary Care Physician: Galen Manila, NP (Inactive)  Virtual Visit via Video Note  I connected with patient on 06/28/19 at  2:45 PM EST by video and verified that I am speaking with the correct person using two identifiers.   I discussed the limitations, risks, security and privacy concerns of performing an evaluation and management service by video  and the availability of in person appointments. I also discussed with the patient that there may be a patient responsible charge related to this service. The patient expressed understanding and agreed to proceed.  Location of Patient: Home Location of Provider: Home Persons involved: Patient and provider only   History of Present Illness:   F/u with colitis   HPI: Tammy Bennett is a 38 y.o. female   Summary of history :  She was initially referred and seen on 05/12/2019 as an ER follow-up visit when she presented to the emergency room on 03/29/2019 with abdominal pain.A CT scan of the abdomen was performed on 03/29/2019 and showed new wall thickening and pericolonic inflammatory changes in the cecum and ascending colon consistent with colitis.  Normal-appearing appendix.  03/29/2019: Stool for C. difficile, GI PCR was negative.A CT scan on 02/24/2019 showed no intra-abdominal abnormality.  Similarly CT scan of the abdomen renal stone protocol in October 2019 showed no abnormalities.  Initially there is concern for inflammatory bowel disease.  Interval history 05/12/2019-06/28/2019  05/30/2019 colonoscopy with biopsies of the terminal ileum rectum and right colon showed no evidence of inflammation.  Celiac serology negative.  She says that IBgard has helped her immensely with the pain and discomfort. She has cut down significantly on sodas.      Current Outpatient Medications  Medication Sig Dispense  Refill  . cetirizine (ZYRTEC) 10 MG tablet Take 1 tablet (10 mg total) by mouth daily. (Patient taking differently: Take 10 mg by mouth every morning. )    . dicyclomine (BENTYL) 10 MG capsule Take 1 capsule (10 mg total) by mouth 4 (four) times daily -  before meals and at bedtime. 90 capsule 0  . fluticasone (FLONASE) 50 MCG/ACT nasal spray Place 2 sprays into both nostrils daily. 16 g 6  . hydrOXYzine (ATARAX/VISTARIL) 25 MG tablet Take 1 tablet (25 mg total) by mouth 2 (two) times daily as needed for anxiety. May take 1 additional pill per dose if acute panic attack. Max 3 pills in 24 hours 30 tablet 2  . levonorgestrel (MIRENA, 52 MG,) 20 MCG/24HR IUD 1 each by Intrauterine route once.    . mirtazapine (REMERON) 7.5 MG tablet Take 1 tablet (7.5 mg total) by mouth at bedtime. 30 tablet 2  . ondansetron (ZOFRAN) 4 MG tablet Take 1 tablet (4 mg total) by mouth daily as needed for nausea or vomiting. 10 tablet 0  . oxyCODONE-acetaminophen (PERCOCET) 5-325 MG tablet Take 1 tablet by mouth every 8 (eight) hours as needed. 20 tablet 0  . sertraline (ZOLOFT) 50 MG tablet Take 1.5 tablets (75 mg total) by mouth daily. 45 tablet 2   No current facility-administered medications for this visit.     Allergies as of 06/28/2019 - Review Complete 05/30/2019  Allergen Reaction Noted  . Bacitracin Rash 01/12/2019    Review of Systems:    All systems reviewed and negative except where noted in HPI.  General Appearance:    Alert, cooperative,  no distress, appears stated age  Head:    Normocephalic, without obvious abnormality, atraumatic  Eyes:    PERRL, conjunctiva/corneas clear,  Ears:    Grossly normal hearing    Neurologic:  Grossly normal    Observations/Objective:  Labs: CMP     Component Value Date/Time   NA 137 03/29/2019 0857   K 3.3 (L) 03/29/2019 0857   CL 107 03/29/2019 0857   CO2 22 03/29/2019 0857   GLUCOSE 96 03/29/2019 0857   BUN 10 03/29/2019 0857   CREATININE 0.67  03/29/2019 0857   CREATININE 0.66 10/13/2018 1118   CALCIUM 9.1 03/29/2019 0857   PROT 7.5 03/29/2019 0857   ALBUMIN 4.4 03/29/2019 0857   AST 17 03/29/2019 0857   ALT 9 03/29/2019 0857   ALKPHOS 64 03/29/2019 0857   BILITOT 0.9 03/29/2019 0857   GFRNONAA >60 03/29/2019 0857   GFRNONAA 113 10/13/2018 1118   GFRAA >60 03/29/2019 0857   GFRAA 131 10/13/2018 1118   Lab Results  Component Value Date   WBC 9.4 03/29/2019   HGB 13.3 03/29/2019   HCT 39.6 03/29/2019   MCV 87.2 03/29/2019   PLT 333 03/29/2019    Imaging Studies: No results found.  Assessment and Plan:   Tammy Bennett is a 38 y.o. y/o female here today to follow-up for colitis seen on a CT scan performed when she presented with acute abdominal pain to the ER.  No evidence of inflammation or colitis seen on recent colonoscopy.  Very likely it was an infectious episode which resolved on its own.  She had a lot of gas and bloating at her initial visit very likely secondary secondary to large quantities of high fructose corn syrup in her shoulders.  She has cut down on that and in addition is started on IBgard which seems to have helped her immensely.  I suggested her to continue the same and if things were not to get better to come back and see me at the office.     I discussed the assessment and treatment plan with the patient. The patient was provided an opportunity to ask questions and all were answered. The patient agreed with the plan and demonstrated an understanding of the instructions.   The patient was advised to call back or seek an in-person evaluation if the symptoms worsen or if the condition fails to improve as anticipated.   Dr Jonathon Bellows MD,MRCP [facility]) Gastroenterology/Hepatology Pager: [phone]   Speech recognition software was used to dictate this note.

## 2019-07-04 ENCOUNTER — Ambulatory Visit (INDEPENDENT_AMBULATORY_CARE_PROVIDER_SITE_OTHER): Payer: No Typology Code available for payment source | Admitting: Family Medicine

## 2019-07-04 ENCOUNTER — Encounter: Payer: Self-pay | Admitting: Family Medicine

## 2019-07-04 ENCOUNTER — Other Ambulatory Visit: Payer: Self-pay

## 2019-07-04 DIAGNOSIS — F410 Panic disorder [episodic paroxysmal anxiety] without agoraphobia: Secondary | ICD-10-CM

## 2019-07-04 DIAGNOSIS — N83202 Unspecified ovarian cyst, left side: Secondary | ICD-10-CM | POA: Diagnosis not present

## 2019-07-04 MED ORDER — ESCITALOPRAM OXALATE 20 MG PO TABS: 20.0000 mg | ORAL_TABLET | Freq: Every day | ORAL | 2 refills | Status: DC

## 2019-07-04 NOTE — Patient Instructions (Addendum)
OB / GYN  Please call them to schedule and follow-up with them.  Tammy Bennett, [place]   Address: [address] [place], [place], [place], [place] [address] Hours: 8AM-5PM Phone: [phone]  NEW medicine for anxiety / mood - Escitalopram (Lexapro) 20mg  tablet - start with HALF pill once daily for 10mg  daily for anxiety and mood, after 2-4 weeks can increase up to 1 whole pill if you prefer. May take several weeks for full effect  Can continue Mirtazapin and Hydroxyzine as well.  Please schedule a Follow-up Appointment to: Return in about 3 months (around 10/04/2019) for 3 month follow-up anxiety, meet new provider Elmyra Ricks.   (nurse practitioner who is replacing Cassell Smiles)  If you have any other questions or concerns, please feel free to call the office or send a message through [place]. You may also schedule an earlier appointment if necessary.  Additionally, you may be receiving a survey about your experience at our office within a few days to 1 week by e-mail or mail. We value your feedback.  [name], DO [place]

## 2019-07-04 NOTE — Progress Notes (Signed)
Virtual Visit via Telephone The purpose of this virtual visit is to provide medical care while limiting exposure to the novel coronavirus (COVID19) for both patient and office staff.  Consent was obtained for phone visit:  Yes.   Answered questions that patient had about telehealth interaction:  Yes.   I discussed the limitations, risks, security and privacy concerns of performing an evaluation and management service by telephone. I also discussed with the patient that there may be a patient responsible charge related to this service. The patient expressed understanding and agreed to proceed.  Patient Location: Home Provider Location: [facility])  ---------------------------------------------------------------------- Chief Complaint  Patient presents with  . Anxiety    S: Reviewed CMA documentation. I have called patient and gathered additional HPI as follows:  Anxiety with Panic attacks / Depression - Last visit with me virtual 01/2019, for initial visit for same problem anxiety mood, treated with new start SSRI Sertraline and hydroxyzine, see prior notes for background information. - Interval update without significant resolution with symptoms, seems mood had improved but anxiety has persisted and worsened at times. - Today patient reports she continues to see Delight Ovens LCSW for therapy but has not been recently due to insurance issue etc. She plan to resume. She tapered off Sertraline last week, now off medication. She does take mirtazapine nightly. She has hydroxyzine PRN. Admits feels stressed and overwhelmed, treatment isn't working, feels more irritable and patience is very quick. Often will wake up with sweats and nervous stomach, also can get some temperature fluctuation. She does take a probiotic from GI specialist.  Fam history of early menopause at age 47. She is asking about possible hormonal cause of her symptoms with hot flashes and also has some  irregular menstrual cycle and left ovarian issue with cyst that is recurrent and can be painful. She has Risk analyst, has not been back to them in >6 months.  - Most triggers for her mood and anxiety have been coronavirus pandemic and out of work and other social / life stressors  Denies any high risk travel to areas of current concern for COVID19. Denies any known or suspected exposure to person with or possibly with COVID19.  Denies any fevers, chills, sweats, body ache, cough, shortness of breath, sinus pain or pressure, headache, abdominal pain, diarrhea  Past Medical History:  Diagnosis Date  . Allergy   . Anemia    DURING PREGNANCY  . Anxiety   . Depression   . Gastritis   . GERD (gastroesophageal reflux disease)   . IBS (irritable bowel syndrome)   . Murmur   . SVT (supraventricular tachycardia) (HCC)   . Tendonitis    ARM RIGHT   Social History   Tobacco Use  . Smoking status: Never Smoker  . Smokeless tobacco: Never Used  Substance Use Topics  . Alcohol use: Yes    Comment: OCC  . Drug use: Not Currently    Types: Marijuana    Comment: none in years    Current Outpatient Medications:  .  cetirizine (ZYRTEC) 10 MG tablet, Take 1 tablet (10 mg total) by mouth daily. (Patient taking differently: Take 10 mg by mouth every morning. ), Disp: , Rfl:  .  dicyclomine (BENTYL) 10 MG capsule, Take 1 capsule (10 mg total) by mouth 4 (four) times daily -  before meals and at bedtime., Disp: 90 capsule, Rfl: 0 .  fluticasone (FLONASE) 50 MCG/ACT nasal spray, Place 2 sprays into both nostrils  daily., Disp: 16 g, Rfl: 6 .  hydrOXYzine (ATARAX/VISTARIL) 25 MG tablet, Take 1 tablet (25 mg total) by mouth 2 (two) times daily as needed for anxiety. May take 1 additional pill per dose if acute panic attack. Max 3 pills in 24 hours, Disp: 30 tablet, Rfl: 2 .  levonorgestrel (MIRENA, 52 MG,) 20 MCG/24HR IUD, 1 each by Intrauterine route once., Disp: , Rfl:  .  mirtazapine  (REMERON) 7.5 MG tablet, Take 1 tablet (7.5 mg total) by mouth at bedtime., Disp: 30 tablet, Rfl: 2 .  ondansetron (ZOFRAN) 4 MG tablet, Take 1 tablet (4 mg total) by mouth daily as needed for nausea or vomiting., Disp: 10 tablet, Rfl: 0 .  escitalopram (LEXAPRO) 20 MG tablet, Take 1 tablet (20 mg total) by mouth daily. Start with half tablet once daily (  dose) for 2-4 weeks, then increase to whole tablet., Disp: 30 tablet, Rfl: 2  Depression screen [facility] 2/9 07/04/2019 01/25/2019 12/20/2018  Decreased Interest 1 2 0  Down, Depressed, Hopeless 1 2 0  PHQ - 2 Score 2 4 0  Altered sleeping Tired, decreased energy Change in appetite Feeling bad or failure about yourself  1 1 0  Trouble concentrating Moving slowly or fidgety/restless 0 0 0  Suicidal thoughts 0 0 0  PHQ-9 Score Difficult doing work/chores Very difficult Somewhat difficult Not difficult at all    GAD 7 : Generalized Anxiety Score 07/04/2019 01/25/2019 12/20/2018 10/13/2018  Nervous, Anxious, on Edge Control/stop worrying Worry too much - different things Trouble relaxing Restless 3 1 0 3  Easily annoyed or irritable Afraid - awful might happen 3 1 0 3  Total GAD 7 Score Anxiety Difficulty Very difficult Not difficult at all Not difficult at all Very difficult    -------------------------------------------------------------------------- O: No physical exam performed due to remote telephone encounter.  Lab results reviewed.  Recent Results (from the past 2160 hour(s))  Celiac Disease Ab Screen w/Rfx     Status: None   Collection Time: 05/12/19  3:33 PM  Result Value Ref Range   Antigliadin Abs, IgA 6 0 - 19 units    Comment:                    Negative                   0 - 19                    Weak Positive             20 - 30                    Moderate to Strong Positive   >30    Transglutaminase IgA <2 0 - 3 U/mL     Comment:                               Negative        0 -  3  Weak Positive   4 - 10                               Positive           >10  Tissue Transglutaminase (tTG) has been identified  as the endomysial antigen.  Studies have demonstr-  ated that endomysial IgA antibodies have over 99%  specificity for gluten sensitive enteropathy.    IgA/Immunoglobulin A, Serum 205 87 - 352 mg/dL  SARS CORONAVIRUS 2 (TAT 6-24 HRS) Nasopharyngeal Nasopharyngeal Swab     Status: None   Collection Time: 05/26/19 12:31 PM   Specimen: Nasopharyngeal Swab  Result Value Ref Range   SARS Coronavirus 2 NEGATIVE NEGATIVE    Comment: (NOTE) SARS-CoV-2 target nucleic acids are NOT DETECTED. The SARS-CoV-2 RNA is generally detectable in upper and lower respiratory specimens during the acute phase of infection. Negative results do not preclude SARS-CoV-2 infection, do not rule out co-infections with other pathogens, and should not be used as the sole basis for treatment or other patient management decisions. Negative results must be combined with clinical observations, patient history, and epidemiological information. The expected result is Negative. Fact Sheet for Patients: HairSlick.nohttps://www.fda.gov/media/138098/download Fact Sheet for Healthcare Providers: quierodirigir.comhttps://www.fda.gov/media/138095/download This test is not yet approved or cleared by the [place] FDA and  has been authorized for detection and/or diagnosis of SARS-CoV-2 by FDA under an Emergency Use Authorization (EUA). This EUA will remain  in effect (meaning this test can be used) for the duration of the COVID-19 declaration under Section 56 4(b)(1) of the Act, 21 U.S.C. section 360bbb-3(b)(1), unless the authorization is terminated or revoked sooner. Performed at [facility], [address] [place], [place] [address]   Pregnancy, urine POC     Status: None   Collection Time: 05/30/19  8:13 AM  Result  Value Ref Range   Preg Test, Ur NEGATIVE NEGATIVE    Comment:        THE SENSITIVITY OF THIS METHODOLOGY IS >24 mIU/mL   Surgical pathology     Status: None   Collection Time: 05/30/19  8:58 AM  Result Value Ref Range   SURGICAL PATHOLOGY      SURGICAL PATHOLOGY CASE: [id number] PATIENT: Syble Siguenza Surgical Pathology Report     Specimen Submitted: A. Terminal ileum; cbx B. Colon, right; cbx C. Rectum; cbx  Clinical History: K52.9.  Normal study      DIAGNOSIS: A. TERMINAL ILEUM; BIOPSY: - LYMPHOID HYPERPLASIA. - NEGATIVE FOR ACTIVE ILEITIS, GRANULOMAS AND DYSPLASIA.  B. COLON, RIGHT; BIOPSY: - FRAGMENTS OF BENIGN COLONIC MUCOSA. - NEGATIVE FOR ACTIVE MUCOSAL INFLAMMATION, LYMPHOCYTIC/MICROSCOPIC COLITIS AND DYSPLASIA.  C. RECTUM; BIOPSY: - FRAGMENTS OF BENIGN RECTAL MUCOSA. - NEGATIVE FOR ACTIVE MUCOSAL INFLAMMATION, LYMPHOCYTIC/MICROSCOPIC COLITIS AND DYSPLASIA.   GROSS DESCRIPTION: A. Labeled: C BXs terminal ileum-rule out Crohn's Received: Formalin Tissue fragment(s): Multiple Size: Aggregate, 0.8 x 0.3 x 0.1 cm Description: Tan soft tissue fragments Entirely submitted in 1 cassette.  B. Labeled: C BXs right colon Received: Formalin Tissue fragment(s): Multiple Size: A ggregate, 0.7 x 0.3 x 0.1 cm Description: Tan soft tissue fragments Entirely submitted in 1 cassette.  C. Labeled: C BXs rectum Received: Formalin Tissue fragment(s): Multiple Size: Aggregate, 0.6 x 0.3 x 0.1 cm Description: Tan soft tissue fragments Entirely submitted in 1 cassette.    Final Diagnosis performed by Redmond PullingErnest Andree, MD.   Electronically signed 05/31/2019 9:00:34AM The electronic signature indicates that  the named Attending Pathologist has evaluated the specimen Technical component performed at [facility], [address] [place], [place] [address] Lab: [id number] Dir: Rush Farmer, MD, MMM  Professional component performed at [facility], [facility], [place], [place], [place] [address] Lab: [phone] Dir: Dellia Nims. Rubinas, MD     -------------------------------------------------------------------------- A&P:  Problem List Items Addressed This Visit    Severe anxiety with panic - Primary   Relevant Medications   escitalopram (LEXAPRO) 20 MG tablet    Other Visit Diagnoses    Left ovarian cyst         #Anxiety Interval partial improved mood but now persistent acute anxiety, with panic, worsening life stressors Comorbid insomnia, mood with depression vs adjustment OFF Sertraline now  Plan SWITCHED SSRI now to Escitalopram (lexapro) 20mg  daily - start HALF tablet for 10mg  daily for 2-4 weeks then titrate up, reviewed benefits - May continue  MIrtazapine for mood and insomnia, and Hydroxyzine PRN - Continue with therapist - Buena Irish LCSW   #Possible Early Menopausal symptoms / Left Ovarian cyst recurrent / Abnormal Uterine bleeding Patient previously established with [facility] Dr Gilman Schmidt - advised her to contact their office and schedule for follow-up to discuss these concerns - Possibly hormonal cause of her symptoms now with anxiety / hot flash temperature irregularity - Recent thyroid checked was normal, less likely  Meds ordered this encounter  Medications  . escitalopram (LEXAPRO) 20 MG tablet    Sig: Take 1 tablet (20 mg total) by mouth daily. Start with half tablet once daily (10mg  dose) for 2-4 weeks, then increase to whole tablet.    Dispense:  30 tablet    Refill:  2    Discontinued Zoloft(sertraline)    Follow-up: - Return in 3 months for anxiety w/ new provider  Patient verbalizes understanding with the above medical recommendations including the limitation of remote medical advice.  Specific follow-up and call-back criteria were given for patient to follow-up or seek medical care more urgently if needed.  - Time spent in direct consultation with patient on phone: 9  minutes  [name], [facility] 07/04/2019, 1:30 PM

## 2019-07-13 ENCOUNTER — Ambulatory Visit: Payer: No Typology Code available for payment source | Admitting: Obstetrics and Gynecology

## 2019-07-28 ENCOUNTER — Ambulatory Visit: Payer: No Typology Code available for payment source | Admitting: Obstetrics and Gynecology

## 2019-07-28 NOTE — Progress Notes (Deleted)
Mikey College, NP (Inactive)   No chief complaint on file.   HPI:      Ms. Tammy Bennett is a 38 y.o. [id number] who LMP was No LMP recorded., presents today for *** Had pelvic pain 7/20 and never did u/s.   Has Mirena IUD placed about 3 yrs ago   Patient Active Problem List   Diagnosis Date Noted  . Cyst of right nipple 01/06/2019  . 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 Severe anxiety with panic 10/13/2018    Past Surgical History:  Procedure Laterality Date  . catheter abaltion to heart     Treated SVT as teenager  . CHOLECYSTECTOMY    . COLONOSCOPY WITH PROPOFOL N/A 05/30/2019   Procedure: COLONOSCOPY WITH PROPOFOL;  Surgeon: Jonathon Bellows, MD;  Location: [facility]COPY;  Service: Gastroenterology;  Laterality: N/A;  . EVACUATION BREAST HEMATOMA Right 01/12/2019   Procedure: RIGHT EXCISION NIPPLE ABSCESS;  Surgeon: Robert Bellow, MD;  Location: [facility] ORS;  Service: General;  Laterality: Right;  . TONSILLECTOMY    . TUBAL LIGATION      Family History  Problem Relation Age of Onset  . Stroke Mother   . Stomach cancer Maternal Grandmother   . Leukemia Maternal Grandfather   . Pancreatic cancer Paternal Grandmother   . Healthy Brother   . Sickle cell anemia Half-Sister   . Crohn's disease Half-Sister   . Ulcerative colitis Half-Sister   . Thyroid disease Cousin   . Breast cancer Maternal Aunt 43    Social History   Socioeconomic History  . Marital status: Married    Spouse name: Not on file  . Number of children: Not on file  . Years of education: Not on file  . Highest education level: Not on file  Occupational History  . Not on file  Tobacco Use  . Smoking status: Never Smoker  . Smokeless tobacco: Never Used  Substance and Sexual Activity  . Alcohol use: Yes    Comment: OCC  . Drug use: Not Currently    Types: Marijuana    Comment: none in years  . Sexual activity: Yes    Birth control/protection: Surgical, I.U.D.   Comment: Tubal/ IUD  Other Topics Concern  . Not on file  Social History Narrative   ** Merged History Encounter **       Social Determinants of Health   Financial Resource Strain:   . Difficulty of Paying Living Expenses: Not on file  Food Insecurity:   . Worried About Charity fundraiser in the Last Year: Not on file  . Ran Out of Food in the Last Year: Not on file  Transportation Needs:   . Lack of Transportation (Medical): Not on file  . Lack of Transportation (Non-Medical): Not on file  Physical Activity:   . Days of Exercise per Week: Not on file  . Minutes of Exercise per Session: Not on file  Stress:   . Feeling of Stress : Not on file  Social Connections:   . Frequency of Communication with Friends and Family: Not on file  . Frequency of Social Gatherings with Friends and Family: Not on file  . Attends Religious Services: Not on file  . Active Member of Clubs or Organizations: Not on file  . Attends Archivist Meetings: Not on file  . Marital Status: Not on file  Intimate Partner Violence:   . Fear of Current or Ex-Partner: Not on file  .  Emotionally Abused: Not on file  . Physically Abused: Not on file  . Sexually Abused: Not on file    Outpatient Medications Prior to Visit  Medication Sig Dispense Refill  . cetirizine (ZYRTEC) 10 MG tablet Take 1 tablet (10 mg total) by mouth daily. (Patient taking differently: Take 10 mg by mouth every morning. )    . dicyclomine (BENTYL) 10 MG capsule Take 1 capsule (10 mg total) by mouth 4 (four) times daily -  before meals and at bedtime. 90 capsule 0  . escitalopram (LEXAPRO) 20 MG tablet Take 1 tablet (20 mg total) by mouth daily. Start with half tablet once daily (10mg  dose) for 2-4 weeks, then increase to whole tablet. 30 tablet 2  . fluticasone (FLONASE) 50 MCG/ACT nasal spray Place 2 sprays into both nostrils daily. 16 g 6  . hydrOXYzine (ATARAX/VISTARIL) 25 MG tablet Take 1 tablet (25 mg total) by mouth 2  (two) times daily as needed for anxiety. May take 1 additional pill per dose if acute panic attack. Max 3 pills in 24 hours 30 tablet 2  . levonorgestrel (MIRENA, 52 MG,) 20 MCG/24HR IUD 1 each by Intrauterine route once.    . mirtazapine (REMERON) 7.5 MG tablet Take 1 tablet (7.5 mg total) by mouth at bedtime. 30 tablet 2  . ondansetron (ZOFRAN) 4 MG tablet Take 1 tablet (4 mg total) by mouth daily as needed for nausea or vomiting. 10 tablet 0   No facility-administered medications prior to visit.      ROS:  Review of Systems BREAST: No symptoms   OBJECTIVE:   Vitals:  There were no vitals taken for this visit.  Physical Exam  Results: No results found for this or any previous visit (from the past 24 hour(s)).   Assessment/Plan: No diagnosis found.    No orders of the defined types were placed in this encounter.     No follow-ups on file.  Rueben Kassim B. Seena Face, PA-C 07/28/2019 9:29 AM

## 2019-08-03 ENCOUNTER — Ambulatory Visit: Payer: No Typology Code available for payment source | Admitting: Obstetrics and Gynecology

## 2020-01-17 ENCOUNTER — Other Ambulatory Visit: Payer: Self-pay | Admitting: Obstetrics and Gynecology

## 2020-01-17 DIAGNOSIS — F3281 Premenstrual dysphoric disorder: Secondary | ICD-10-CM

## 2020-02-16 ENCOUNTER — Other Ambulatory Visit: Payer: Self-pay | Admitting: Family Medicine

## 2020-02-16 DIAGNOSIS — F410 Panic disorder [episodic paroxysmal anxiety] without agoraphobia: Secondary | ICD-10-CM

## 2020-02-16 DIAGNOSIS — F5104 Psychophysiologic insomnia: Secondary | ICD-10-CM

## 2020-03-26 ENCOUNTER — Encounter: Payer: Self-pay | Admitting: Family Medicine

## 2020-03-26 ENCOUNTER — Emergency Department: Payer: BC Managed Care – PPO

## 2020-03-26 ENCOUNTER — Other Ambulatory Visit: Payer: Self-pay

## 2020-03-26 ENCOUNTER — Telehealth (INDEPENDENT_AMBULATORY_CARE_PROVIDER_SITE_OTHER): Payer: BC Managed Care – PPO | Admitting: Family Medicine

## 2020-03-26 ENCOUNTER — Emergency Department
Admission: EM | Admit: 2020-03-26 | Discharge: 2020-03-26 | Disposition: A | Discharge status: Home / Self Care | Payer: BC Managed Care – PPO | Attending: Emergency Medicine | Admitting: Emergency Medicine

## 2020-03-26 DIAGNOSIS — R310 Gross hematuria: Secondary | ICD-10-CM | POA: Diagnosis not present

## 2020-03-26 DIAGNOSIS — Z79899 Other long term (current) drug therapy: Secondary | ICD-10-CM | POA: Diagnosis not present

## 2020-03-26 DIAGNOSIS — R109 Unspecified abdominal pain: Secondary | ICD-10-CM | POA: Diagnosis not present

## 2020-03-26 DIAGNOSIS — R319 Hematuria, unspecified: Secondary | ICD-10-CM | POA: Diagnosis not present

## 2020-03-26 DIAGNOSIS — R112 Nausea with vomiting, unspecified: Secondary | ICD-10-CM | POA: Diagnosis not present

## 2020-03-26 DIAGNOSIS — R197 Diarrhea, unspecified: Secondary | ICD-10-CM

## 2020-03-26 DIAGNOSIS — R1030 Lower abdominal pain, unspecified: Secondary | ICD-10-CM | POA: Diagnosis not present

## 2020-03-26 DIAGNOSIS — M545 Low back pain: Secondary | ICD-10-CM | POA: Diagnosis not present

## 2020-03-26 LAB — URINALYSIS, COMPLETE (UACMP) WITH MICROSCOPIC
Bacteria, UA: NONE SEEN
Bilirubin Urine: NEGATIVE
Glucose, UA: NEGATIVE mg/dL
Ketones, ur: 20 mg/dL — AB
Leukocytes,Ua: NEGATIVE
Nitrite: NEGATIVE
Protein, ur: NEGATIVE mg/dL
Specific Gravity, Urine: 1.021 (ref 1.005–1.030)
pH: 7 (ref 5.0–8.0)

## 2020-03-26 LAB — CBC
HCT: 38.6 % (ref 36.0–46.0)
Hemoglobin: 12.8 g/dL (ref 12.0–15.0)
MCH: 29.6 pg (ref 26.0–34.0)
MCHC: 33.2 g/dL (ref 30.0–36.0)
MCV: 89.4 fL (ref 80.0–100.0)
Platelets: 327 10*3/uL (ref 150–400)
RBC: 4.32 MIL/uL (ref 3.87–5.11)
RDW: 12.3 % (ref 11.5–15.5)
WBC: 9 10*3/uL (ref 4.0–10.5)
nRBC: 0 % (ref 0.0–0.2)

## 2020-03-26 LAB — COMPREHENSIVE METABOLIC PANEL
ALT: 10 U/L (ref 0–44)
AST: 16 U/L (ref 15–41)
Albumin: 4.6 g/dL (ref 3.5–5.0)
Alkaline Phosphatase: 58 U/L (ref 38–126)
Anion gap: 11 (ref 5–15)
BUN: 11 mg/dL (ref 6–20)
CO2: 21 mmol/L — ABNORMAL LOW (ref 22–32)
Calcium: 9.2 mg/dL (ref 8.9–10.3)
Chloride: 105 mmol/L (ref 98–111)
Creatinine, Ser: 0.75 mg/dL (ref 0.44–1.00)
GFR calc Af Amer: >60 mL/min (ref >60)
GFR calc non Af Amer: >60 mL/min (ref >60)
Glucose, Bld: 87 mg/dL (ref 70–99)
Potassium: 3.6 mmol/L (ref 3.5–5.1)
Sodium: 137 mmol/L (ref 135–145)
Total Bilirubin: 0.7 mg/dL (ref 0.3–1.2)
Total Protein: 7.9 g/dL (ref 6.5–8.1)

## 2020-03-26 LAB — LIPASE, BLOOD: Lipase: 22 U/L (ref 11–51)

## 2020-03-26 MED ORDER — CEPHALEXIN 500 MG PO CAPS: 500.0000 mg | ORAL_CAPSULE | Freq: Two times a day (BID) | ORAL | 0 refills | Status: AC

## 2020-03-26 MED ORDER — SODIUM CHLORIDE 0.9 % IV BOLUS
1000.0000 mL | Freq: Once | INTRAVENOUS | Status: AC
  Administered 2020-03-26: 1000 mL via INTRAVENOUS

## 2020-03-26 MED ORDER — IOHEXOL 350 MG/ML SOLN
100.0000 mL | Freq: Once | INTRAVENOUS | Status: AC | PRN
  Administered 2020-03-26: 100 mL via INTRAVENOUS
  Filled 2020-03-26: qty 100

## 2020-03-26 MED ORDER — TRAMADOL HCL 50 MG PO TABS: 50.0000 mg | ORAL_TABLET | Freq: Four times a day (QID) | ORAL | 0 refills | Status: AC | PRN

## 2020-03-26 MED ORDER — KETOROLAC TROMETHAMINE 30 MG/ML IJ SOLN
30.0000 mg | Freq: Once | INTRAMUSCULAR | Status: AC
  Administered 2020-03-26: 30 mg via INTRAVENOUS
  Filled 2020-03-26: qty 1

## 2020-03-26 MED ORDER — MORPHINE SULFATE (PF) 4 MG/ML IV SOLN
4.0000 mg | Freq: Once | INTRAVENOUS | Status: AC
  Administered 2020-03-26: 4 mg via INTRAVENOUS
  Filled 2020-03-26: qty 1

## 2020-03-26 MED ORDER — ONDANSETRON HCL 4 MG/2ML IJ SOLN
4.0000 mg | Freq: Once | INTRAMUSCULAR | Status: AC
  Administered 2020-03-26: 4 mg via INTRAVENOUS
  Filled 2020-03-26: qty 2

## 2020-03-26 NOTE — ED Notes (Signed)
EDP Siadecki states he will go to bedside soon.

## 2020-03-26 NOTE — ED Notes (Signed)
Bed locked low. Rail up. Call bell within reach.

## 2020-03-26 NOTE — Patient Instructions (Signed)
As we discussed, proceed to [facility] Emergency Department.  I have contacted their Triage RN, Victorino Dike, and provided her with an update on your symptoms and that you are on you way there.  Once you have visited with the ER and we have this acute condition treated, we will want to see you in the office for follow up care within 1-2 weeks.  You will receive a survey after today's visit either digitally by e-mail or paper by [employer]. Your experiences and feedback matter to [place].  Please respond so we know how we are doing as we provide care for you.  Call us with any questions/concerns/needs.  It is my goal to be available to you for your health concerns.  Thanks for choosing me to be a partner in your healthcare needs!  Charlaine Dalton, FNP-C Family Nurse Practitioner [facility] Phone: [phone]

## 2020-03-26 NOTE — Assessment & Plan Note (Signed)
See nausea, vomiting and diarrhea A/P

## 2020-03-26 NOTE — ED Notes (Signed)
This RN to bedside with meds. Pt off unit at imaging. Will give meds once pt back to room.

## 2020-03-26 NOTE — Discharge Instructions (Signed)
Take the antibiotic as prescribed and finish the full course.  You may take the tramadol as needed for pain.  Follow-up with your doctor within 1 to 2 weeks to recheck the urine and make sure that the red blood cells in the urine cleared up.  Return to the ER for new, worsening, or persistent flank or abdominal pain, vomiting, fever, weakness, or any other new or worsening symptoms that concern you.

## 2020-03-26 NOTE — ED Notes (Signed)
Pt reports history of episode like this when she had a UTI. States she doesn't normally exhibit any symptoms until she suddenly has extreme back pain. Pt laying calmly in bed; grimacing at times. BMs recently have been loose per pt; last BM today. Urination frequent.

## 2020-03-26 NOTE — ED Triage Notes (Signed)
Pt comes via POV from home with c/o lower back pain and some kidney pain. Pt states blood in urine. Pt states this started this am. Pt states severe pain.  Pt states frequent urination

## 2020-03-26 NOTE — Assessment & Plan Note (Signed)
Reported since 6am on 03/26/2020.  Self reported history of pyelonephritis in the past.  Currently with n/v/d and right lower back pain that is cramping similar to menses cramps.  Discussed with patient likelihood of kidney stones vs. Pyelonephritis, but either way would need work up through the ER and likely some IV fluids.  Patient in agreement to proceed to [facility].  FNP contacted [facility] ED Triage RN, Victorino Dike, and gave report of patient coming to ER via private vehicle.  Plan: 1. Proceed to [facility] ER for further work up.

## 2020-03-26 NOTE — ED Provider Notes (Signed)
[facility] Emergency Department Provider Note ____________________________________________   First MD Initiated Contact with Patient 03/26/20 1408     (approximate)  I have reviewed the triage vital signs and the nursing notes.   HISTORY  Chief Complaint Back Pain    HPI Tammy Bennett is a 39 y.o. female with PMH as noted below who presents with right-sided low back pain, acute onset around 5 AM today, worsening in intensity, and radiating somewhat down to her lower abdomen.  It is associated with nausea and 1 episode of vomiting.  The patient denies any dysuria, frequency, or hematuria.  She states that the pain feels somewhat like menstrual cramps.  She reports a prior history of a kidney infection with sepsis, but denies any prior kidney stones.  Past Medical History:  Diagnosis Date  . Allergy   . Anemia    DURING PREGNANCY  . Anxiety   . Depression   . Gastritis   . GERD (gastroesophageal reflux disease)   . IBS (irritable bowel syndrome)   . Murmur   . SVT (supraventricular tachycardia) (HCC)   . Tendonitis    ARM RIGHT    Patient Active Problem List   Diagnosis Date Noted  . Nausea vomiting and diarrhea 03/26/2020  . Hematuria 03/26/2020  . Cyst of right nipple 01/06/2019  . 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 Severe anxiety with panic 10/13/2018    Past Surgical History:  Procedure Laterality Date  . catheter abaltion to heart     Treated SVT as teenager  . CHOLECYSTECTOMY    . COLONOSCOPY WITH PROPOFOL N/A 05/30/2019   Procedure: COLONOSCOPY WITH PROPOFOL;  Surgeon: [name], MD;  Location: [facility] ENDOSCOPY;  Service: Gastroenterology;  Laterality: N/A;  . EVACUATION BREAST HEMATOMA Right 01/12/2019   Procedure: RIGHT EXCISION NIPPLE ABSCESS;  Surgeon: Earline Mayotte, MD;  Location: [facility] ORS;  Service: General;  Laterality: Right;  . TONSILLECTOMY    . TUBAL LIGATION      Prior to Admission medications    Medication Sig Start Date End Date Taking? Authorizing Provider  cephALEXin (KEFLEX) 500 MG capsule Take 1 capsule (500 mg total) by mouth 2 (two) times daily for 7 days. 03/26/20 04/02/20  Dionne Bucy, MD  cetirizine (ZYRTEC) 10 MG tablet Take 1 tablet (10 mg total) by mouth daily. Patient taking differently: Take 10 mg by mouth every morning.  10/13/18   Galen Manila, NP  dicyclomine (BENTYL) 10 MG capsule Take 1 capsule (10 mg total) by mouth 4 (four) times daily -  before meals and at bedtime. 05/12/19   [name], MD  escitalopram (LEXAPRO) 20 MG tablet Take 1 tablet (20 mg total) by mouth daily. Start with half tablet once daily (10mg  dose) for 2-4 weeks, then increase to whole tablet. 07/04/19   Karamalegos, 07/06/19, DO  fluticasone (FLONASE) 50 MCG/ACT nasal spray Place 2 sprays into both nostrils daily. 10/13/18   12/13/18, NP  hydrOXYzine (ATARAX/VISTARIL) 25 MG tablet Take 1 tablet (25 mg total) by mouth 2 (two) times daily as needed for anxiety. May take 1 additional pill per dose if acute panic attack. Max 3 pills in 24 hours 01/25/19   Karamalegos, 01/27/19, DO  levonorgestrel (MIRENA, 52 MG,) 20 MCG/24HR IUD 1 each by Intrauterine route once.    [provider]  mirtazapine (REMERON) 7.5 MG tablet TAKE 1 TABLET(7.5 MG) BY MOUTH AT BEDTIME 02/16/20   04/18/20, NP  ondansetron (ZOFRAN) 4 MG tablet Take 1 tablet (4 mg total) by mouth daily as needed for nausea or vomiting. 03/29/19   Jeanmarie Plant, MD  traMADol (ULTRAM) 50 MG tablet Take 1 tablet (50 mg total) by mouth every 6 (six) hours as needed for up to 5 days for severe pain. 03/26/20 03/31/20  Dionne Bucy, MD    Allergies Bacitracin  Family History  Problem Relation Age of Onset  . Stroke Mother   . Stomach cancer Maternal Grandmother   . Leukemia Maternal Grandfather   . Pancreatic cancer Paternal Grandmother   . Healthy Brother   . Sickle cell anemia Half-Sister    . Crohn's disease Half-Sister   . Ulcerative colitis Half-Sister   . Thyroid disease Cousin   . Breast cancer Maternal Aunt 54    Social History Social History   Tobacco Use  . Smoking status: Never Smoker  . Smokeless tobacco: Never Used  Vaping Use  . Vaping Use: Never used  Substance Use Topics  . Alcohol use: Yes    Comment: OCC  . Drug use: Not Currently    Types: Marijuana    Comment: none in years    Review of Systems  Constitutional: No fever/chills. Eyes: No redness. ENT: No sore throat. Cardiovascular: Denies chest pain. Respiratory: Denies shortness of breath. Gastrointestinal: Positive for nausea and vomiting. Genitourinary: Negative for dysuria or hematuria.  Musculoskeletal: Positive for back pain. Skin: Negative for rash. Neurological: Negative for headache.   ____________________________________________   PHYSICAL EXAM:  VITAL SIGNS: ED Triage Vitals  Enc Vitals Group     BP 03/26/20 1253 (!) 124/47     Pulse Rate 03/26/20 1253 (!) 102     Resp 03/26/20 1253 18     Temp 03/26/20 1253 99.3 F (37.4 C)     Temp src --      SpO2 03/26/20 1253 100 %     Weight 03/26/20 1247 150 lb (68 kg)     Height 03/26/20 1247 5\' 3"  (1.6 m)     Head Circumference --      Peak Flow --      Pain Score 03/26/20 1247 10     Pain Loc --      Pain Edu? --      Excl. in GC? --     Constitutional: Alert and oriented.  Uncomfortable appearing but in no acute distress. Eyes: Conjunctivae are normal.  No scleral icterus. Head: Atraumatic. Nose: No congestion/rhinnorhea. Mouth/Throat: Mucous membranes are moist.   Neck: Normal range of motion.  Cardiovascular: Normal rate, regular rhythm.   Good peripheral circulation. Respiratory: Normal respiratory effort.  No retractions.  Gastrointestinal: Soft and nontender. No distention.  Genitourinary: No CVA tenderness. Musculoskeletal: Extremities warm and well perfused.  Neurologic:  Normal speech and language.  No gross focal neurologic deficits are appreciated.  Skin:  Skin is warm and dry. No rash noted. Psychiatric: Mood and affect are normal. Speech and behavior are normal.  ____________________________________________   LABS (all labs ordered are listed, but only abnormal results are displayed)  Labs Reviewed  COMPREHENSIVE METABOLIC PANEL - Abnormal; Notable for the following components:      Result Value   CO2 21 (*)    All other components within normal limits  URINALYSIS, COMPLETE (UACMP) WITH MICROSCOPIC - Abnormal; Notable for the following components:   Color, Urine YELLOW (*)    APPearance HAZY (*)    Hgb urine dipstick LARGE (*)    Ketones, ur  20 (*)    All other components within normal limits  LIPASE, BLOOD  CBC  POC URINE PREG, ED   ____________________________________________  EKG   ____________________________________________  RADIOLOGY  CT abdomen/pelvis: No acute abnormality CT angio abdomen/pelvis: No acute abnormality  ____________________________________________   PROCEDURES  Procedure(s) performed: No  Procedures  Critical Care performed: No ____________________________________________   INITIAL IMPRESSION / ASSESSMENT AND PLAN / ED COURSE  Pertinent labs & imaging results that were available during my care of the patient were reviewed by me and considered in my medical decision making (see chart for details).  39 year old female with PMH as noted above presents with right-sided low back pain since 5 AM today associated with nausea and vomiting, but not with any significant urinary symptoms.  I reviewed the past medical records in Epic.  The patient has no recent prior ED visits or admissions.  She had a virtual outpatient visit earlier today and was instructed to come to the ED.  On exam the patient is uncomfortable but overall well-appearing.  Her vital signs are normal except for borderline tachycardia and borderline elevated temperature.   The abdomen is soft with no focal tenderness.  There is no significant CVA tenderness.  Urinalysis shows RBCs with no other significant findings.  Lab work-up is otherwise reassuring.  Overall presentation favors ureteral stone, although pyelonephritis is also on the differential, in addition to musculoskeletal pain or other benign etiology.  I have a low suspicion for other intra-abdominal cause.  We will obtain a CT to further evaluate, and give fluids and analgesia.  ----------------------------------------- 7:34 PM on 03/26/2020 -----------------------------------------  CT showed no acute findings.  The patient reported minimal improvement with the Toradol.  Given the persistent pain and presence of RBCs on the urinalysis, I began to consider possible vascular etiology although this would be fairly unlikely in an otherwise relatively healthy patient of this age.  Based on shared decision making with the patient I obtained a CT angio with IV contrast to rule out infarct or other vascular etiology, and this was negative as well.  The patient had significant improvement in her pain with morphine.  She appears  comfortable.  Her vital signs have remained stable.  Based on further discussion with her, given her prior history of pyelonephritis and sepsis as well as the low-grade temperature and unexplained hematuria, we discussed the possibility of early/mild urinary tract infection or pyelonephritis.  Therefore I have prescribed the patient a course of Keflex for empiric antibiotic treatment.  I had an extensive discussion with the patient about the results of her work-up and recommended follow-up.  She agrees to follow-up with her primary care doctor within the next 1 to 2 weeks.  I gave her thorough return precautions, and she expressed understanding.    ____________________________________________   FINAL CLINICAL IMPRESSION(S) / ED DIAGNOSES  Final diagnoses:  Flank pain      NEW  MEDICATIONS STARTED DURING THIS VISIT:  New Prescriptions   CEPHALEXIN (KEFLEX) 500 MG CAPSULE    Take 1 capsule (500 mg total) by mouth 2 (two) times daily for 7 days.   TRAMADOL (ULTRAM) 50 MG TABLET    Take 1 tablet (50 mg total) by mouth every 6 (six) hours as needed for up to 5 days for severe pain.     Note:  This document was prepared using Dragon voice recognition software and may include unintentional dictation errors.    Dionne Bucy, MD 03/26/20 [phone]

## 2020-03-26 NOTE — ED Notes (Signed)
See triage note  Presents with lower back pain   Then she noticed some blood in her urine  States pain became more severe this am

## 2020-03-26 NOTE — ED Triage Notes (Signed)
Patient sent by nurse practitioner for right lower back cramping and gross hematuria with N/V/D (rule out kidney stone vs pyelonephritis).

## 2020-03-26 NOTE — Progress Notes (Signed)
Virtual Visit via Telephone  The purpose of this virtual visit is to provide medical care while limiting exposure to the novel coronavirus (COVID19) for both patient and office staff.  Consent was obtained for phone visit:  Yes.   Answered questions that patient had about telehealth interaction:  Yes.   I discussed the limitations, risks, security and privacy concerns of performing an evaluation and management service by telephone. I also discussed with the patient that there may be a patient responsible charge related to this service. The patient expressed understanding and agreed to proceed.  Patient is at home and is accessed via telephone Services are provided by Charlaine Dalton, FNP-C from [facility])  ---------------------------------------------------------------------- Chief Complaint  Patient presents with  . Flank Pain    bilateral back pain, mostly left w/ gross hematuria x 6:00am.  Diarrhea, nausea, vomiting that also started this morning at 6:00am.     S: Reviewed CMA documentation. I have called patient and gathered additional HPI as follows:  Ms. Tammy Bennett presents to clinic via MyChart Video Visit with concerns of gross hematuria with nausea, vomiting and diarrhea since 6am this morning.  Reports right lower back cramping, similar to abdominal cramping with menses.  Has self reported history of pyelonephritis in the past.  Denies any fevers, sore throat, change in taste/smell, cough, SOB, CP.  No known sick contact exposure.  Patient is currently home Denies any high risk travel to areas of current concern for COVID19. Denies any known or suspected exposure to person with or possibly with COVID19.  Past Medical History:  Diagnosis Date  . Allergy   . Anemia    DURING PREGNANCY  . Anxiety   . Depression   . Gastritis   . GERD (gastroesophageal reflux disease)   . IBS (irritable bowel syndrome)   . Murmur   . SVT (supraventricular  tachycardia) (HCC)   . Tendonitis    ARM RIGHT   Social History   Tobacco Use  . Smoking status: Never Smoker  . Smokeless tobacco: Never Used  Vaping Use  . Vaping Use: Never used  Substance Use Topics  . Alcohol use: Yes    Comment: OCC  . Drug use: Not Currently    Types: Marijuana    Comment: none in years    Current Outpatient Medications:  .  cetirizine (ZYRTEC) 10 MG tablet, Take 1 tablet (10 mg total) by mouth daily. (Patient taking differently: Take 10 mg by mouth every morning. ), Disp: , Rfl:  .  dicyclomine (BENTYL) 10 MG capsule, Take 1 capsule (10 mg total) by mouth 4 (four) times daily -  before meals and at bedtime., Disp: 90 capsule, Rfl: 0 .  escitalopram (LEXAPRO) 20 MG tablet, Take 1 tablet (20 mg total) by mouth daily. Start with half tablet once daily (10mg  dose) for 2-4 weeks, then increase to whole tablet., Disp: 30 tablet, Rfl: 2 .  fluticasone (FLONASE) 50 MCG/ACT nasal spray, Place 2 sprays into both nostrils daily., Disp: 16 g, Rfl: 6 .  hydrOXYzine (ATARAX/VISTARIL) 25 MG tablet, Take 1 tablet (25 mg total) by mouth 2 (two) times daily as needed for anxiety. May take 1 additional pill per dose if acute panic attack. Max 3 pills in 24 hours, Disp: 30 tablet, Rfl: 2 .  levonorgestrel (MIRENA, 52 MG,) 20 MCG/24HR IUD, 1 each by Intrauterine route once., Disp: , Rfl:  .  mirtazapine (REMERON) 7.5 MG tablet, TAKE 1 TABLET(7.5 MG) BY MOUTH AT BEDTIME,  Disp: 30 tablet, Rfl: 0 .  ondansetron (ZOFRAN) 4 MG tablet, Take 1 tablet (4 mg total) by mouth daily as needed for nausea or vomiting., Disp: 10 tablet, Rfl: 0  Depression screen [facility] 2/9 07/04/2019 01/25/2019 12/20/2018  Decreased Interest 1 2 0  Down, Depressed, Hopeless 1 2 0  PHQ - 2 Score 2 4 0  Altered sleeping 1 2 1   Tired, decreased energy 1 2 1   Change in appetite 1 2 1   Feeling bad or failure about yourself  1 1 0  Trouble concentrating 1 2 1   Moving slowly or fidgety/restless 0 0 0  Suicidal  thoughts 0 0 0  PHQ-9 Score 7 13 4   Difficult doing work/chores Very difficult Somewhat difficult Not difficult at all    GAD 7 : Generalized Anxiety Score 07/04/2019 01/25/2019 12/20/2018 10/13/2018  Nervous, Anxious, on Edge 3 3 1 3   Control/stop worrying 3 3 1 3   Worry too much - different things 3 3 1 3   Trouble relaxing 3 2 1 3   Restless 3 1 0 3  Easily annoyed or irritable 3 2 1 3   Afraid - awful might happen 3 1 0 3  Total GAD 7 Score 21 15 5 21   Anxiety Difficulty Very difficult Not difficult at all Not difficult at all Very difficult    -------------------------------------------------------------------------- O: No physical exam performed due to remote telephone encounter.  Physical Exam: Patient remotely monitored with video.  Verbal communication appropriate.  Cognition normal.  No results found for this or any previous visit (from the past 2160 hour(s)).  -------------------------------------------------------------------------- A&P:  Problem List Items Addressed This Visit      Digestive   Nausea vomiting and diarrhea    Reported since 6am on 03/26/2020.  Self reported history of pyelonephritis in the past.  Currently with n/v/d and right lower back pain that is cramping similar to menses cramps.  Discussed with patient likelihood of kidney stones vs. Pyelonephritis, but either way would need work up through the ER and likely some IV fluids.  Patient in agreement to proceed to [facility].  FNP contacted [facility] ED Triage RN, 12/13/2018, and gave report of patient coming to ER via private vehicle.  Plan: 1. Proceed to [facility] ER for further work up.        Other   Hematuria    See nausea, vomiting and diarrhea A/P         No orders of the defined types were placed in this encounter.   Follow-up: - Return in 1-2 weeks for follow up visit  Patient verbalizes understanding with the above medical recommendations including the limitation of remote medical  advice.  Specific follow-up and call-back criteria were given for patient to follow-up or seek medical care more urgently if needed.  - Time spent in direct consultation with patient on phone: 6 minutes  , FNP-C [facility] 03/26/2020, 11:39 AM

## 2020-03-26 NOTE — ED Notes (Signed)
Pt up to restroom. Pt talking on personal phone with family/friend when this RN entered room to unplug pt.

## 2020-04-25 DIAGNOSIS — Z03818 Encounter for observation for suspected exposure to other biological agents ruled out: Secondary | ICD-10-CM | POA: Diagnosis not present

## 2020-04-25 DIAGNOSIS — Z1152 Encounter for screening for COVID-19: Secondary | ICD-10-CM | POA: Diagnosis not present

## 2020-05-31 ENCOUNTER — Encounter: Payer: BC Managed Care – PPO | Admitting: Family Medicine

## 2020-06-14 ENCOUNTER — Encounter: Payer: BC Managed Care – PPO | Admitting: Family Medicine

## 2020-07-25 IMAGING — US US PELVIS COMPLETE TRANSABD/TRANSVAG W DUPLEX
1 series · 13 of 25 positions shown · non-contrast
Comparison: CT Abdomen and Pelvis 05/26/2018.

CLINICAL DATA: 38-year-old female with left lower quadrant pain for
1 day. LMP 2 days ago.

EXAM:
TRANSABDOMINAL AND TRANSVAGINAL ULTRASOUND OF PELVIS
DOPPLER ULTRASOUND OF OVARIES
TECHNIQUE: Both transabdominal and transvaginal ultrasound examinations of the
pelvis were performed. Transabdominal technique was performed for
global imaging of the pelvis including uterus, ovaries, adnexal
regions, and pelvic cul-de-sac.
It was necessary to proceed with endovaginal exam following the
transabdominal exam to visualize the ovaries. Color and duplex
Doppler ultrasound was utilized to evaluate blood flow to the
ovaries.

[Series 1: us pelvis complete transabd/transvag w duplex · 69 acquisitions, 13 frames shown]
[im 1/69]
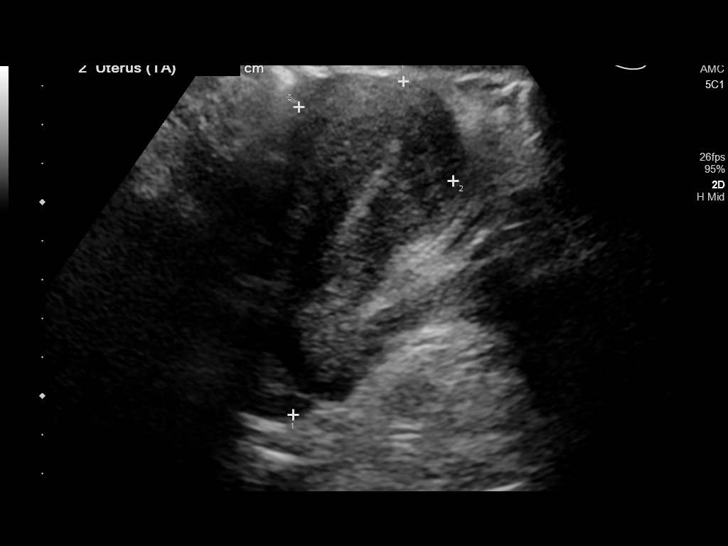
[im 6/69]
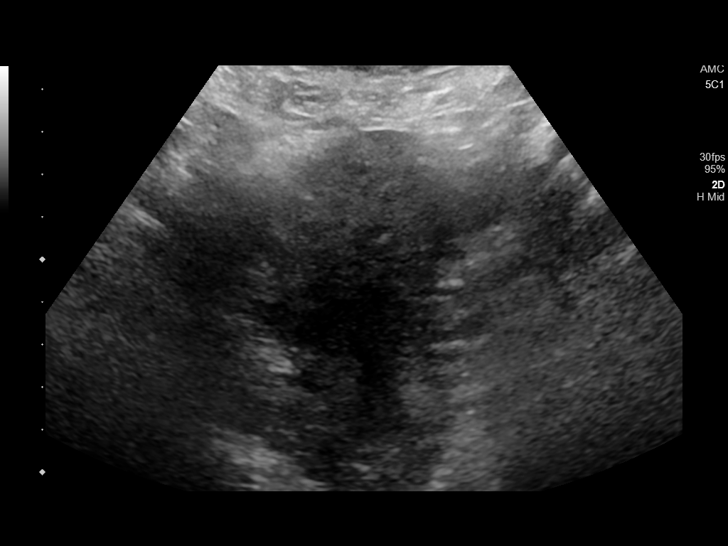
[im 12/69]
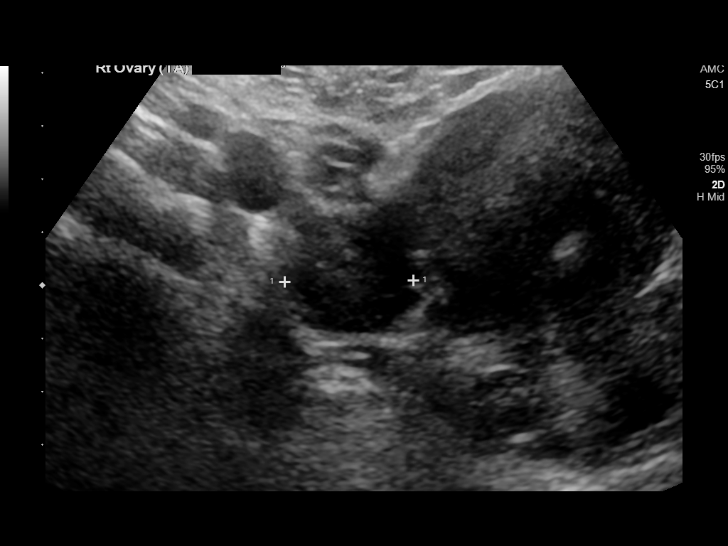
[im 18/69]
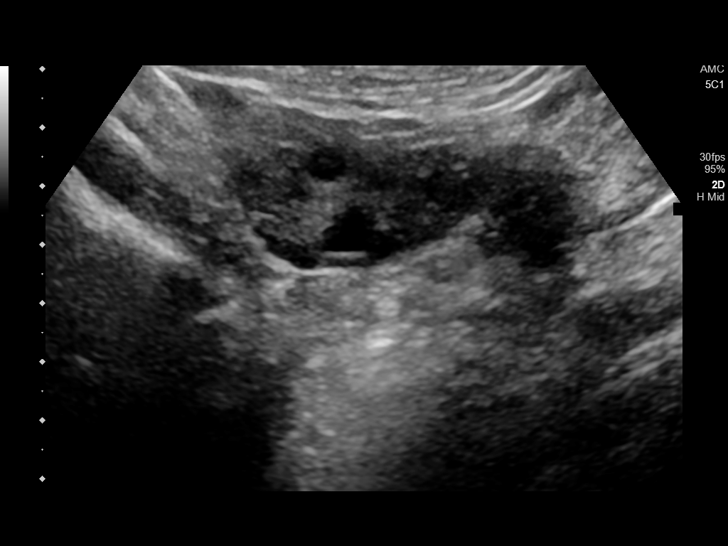
[im 23/69]
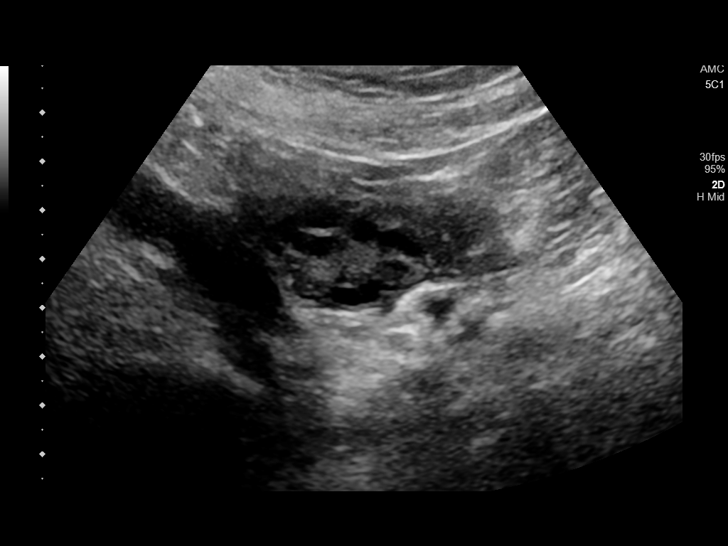
[im 29/69]
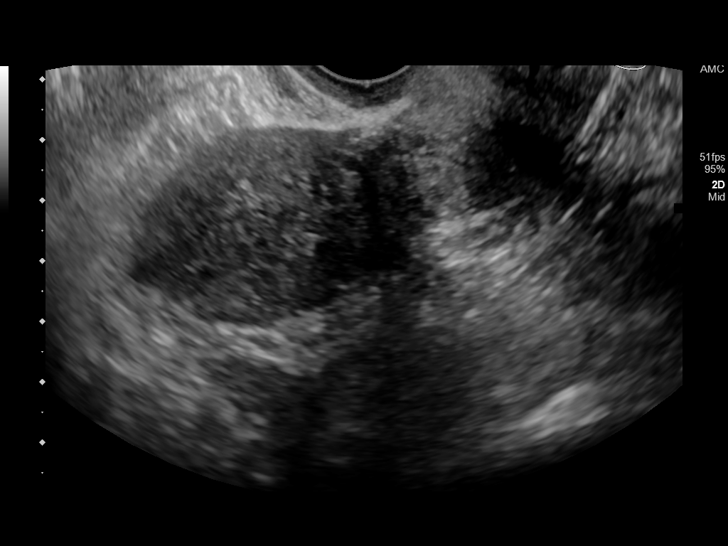
[im 35/69]
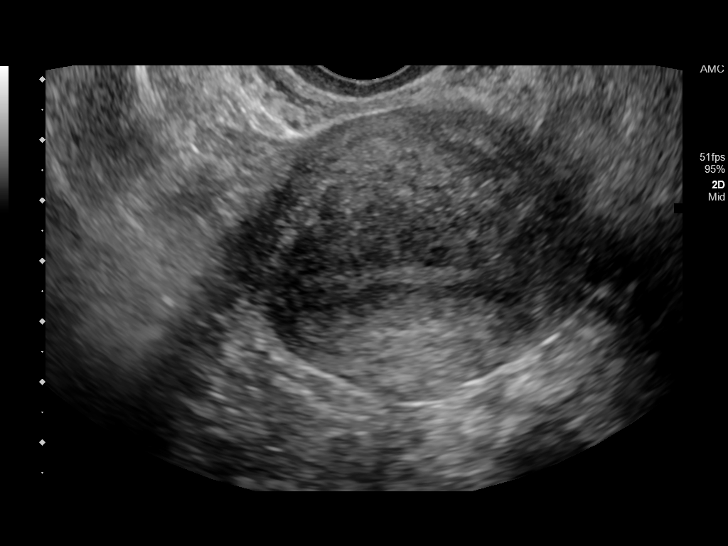
[im 40/69]
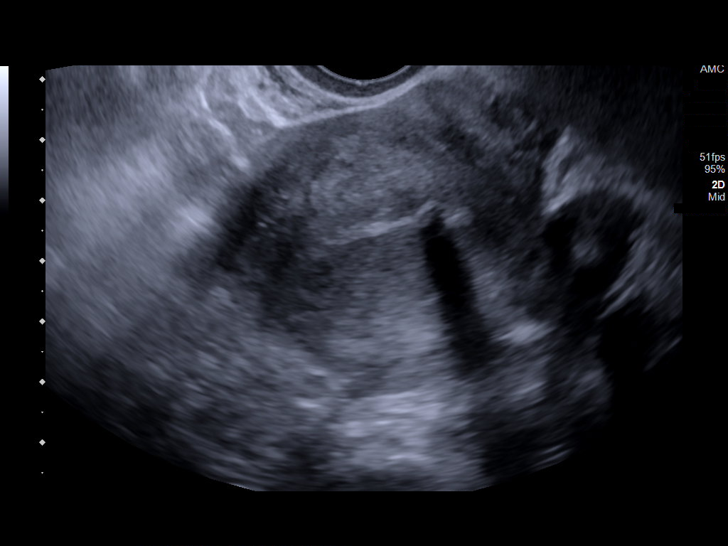
[im 46/69]
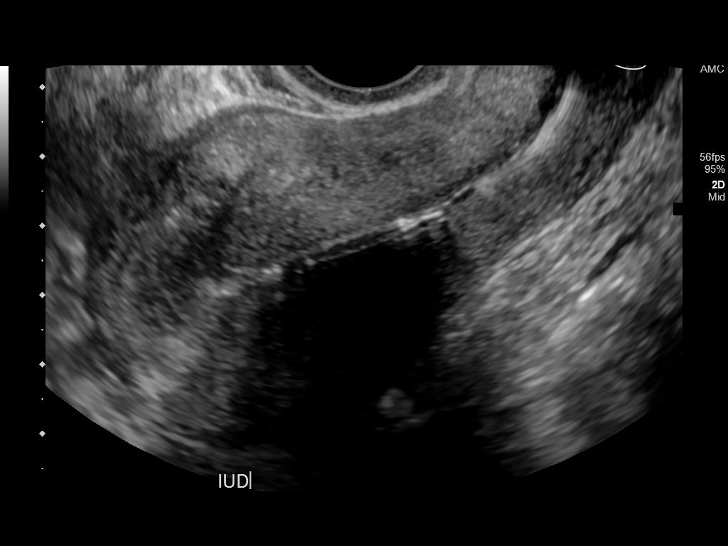
[im 52/69]
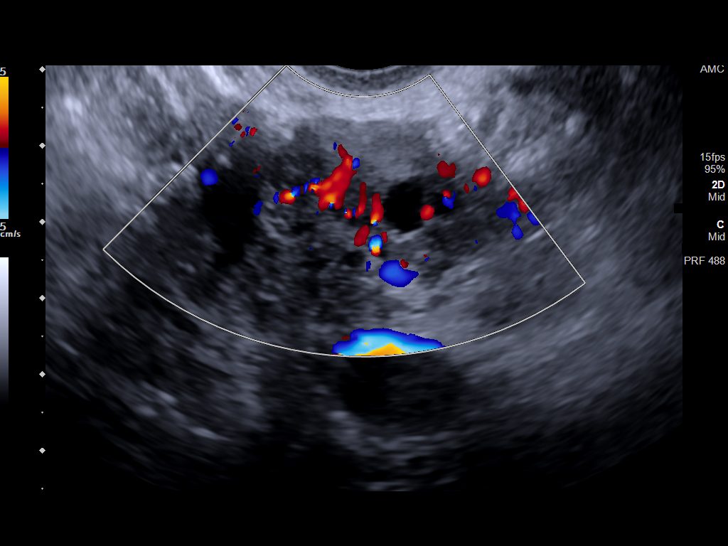
[im 57/69]
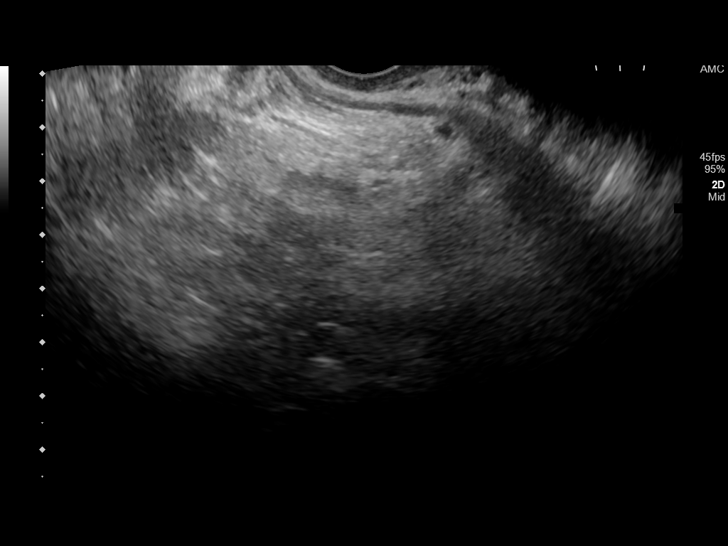
[im 63/69]
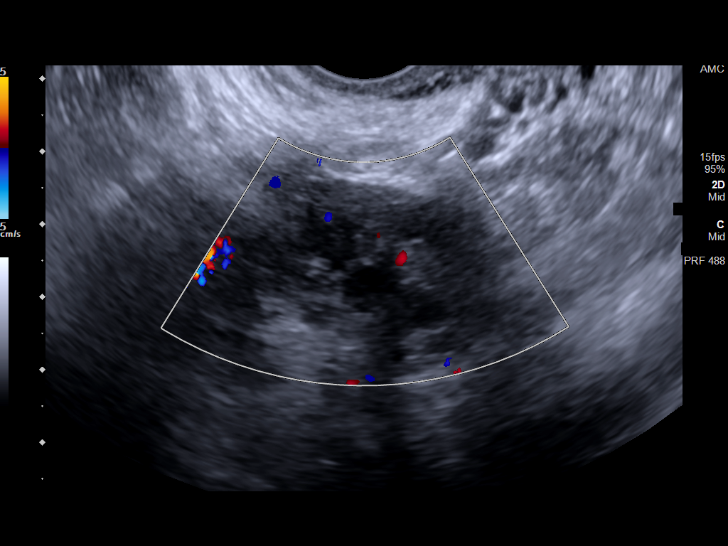
[im 69/69]
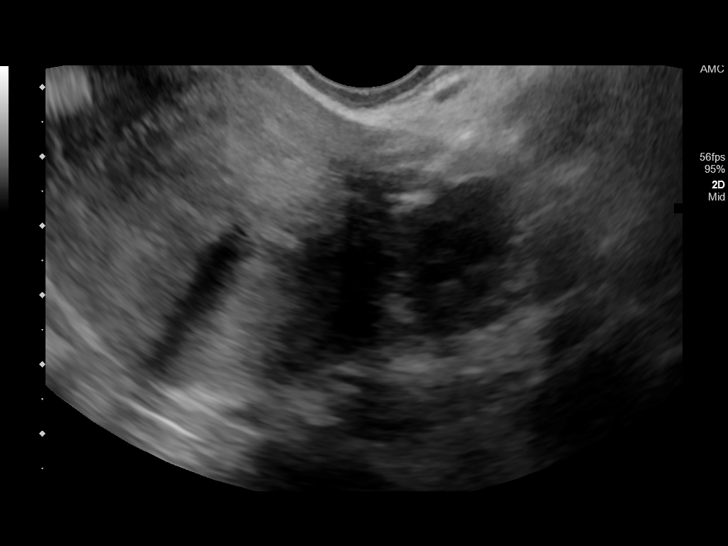

[13 of 25 positions shown; findings below may reference images not displayed]

FINDINGS: Uterus

Measurements: 8.7 x 5.8 x 4.6 centimeters = volume: 121 mL. Possible
small intramural fibroid on image 35 measuring 12 millimeters
diameter. Otherwise negative.

Endometrium

Thickness: 4 millimeters. IUD position appears within normal limits
and stable to that in 3838.

Right ovary

Measurements: 2.9 x 1.9 x 2.6 centimeters = volume: 7 mL. Normal
appearance/no adnexal mass.

Left ovary

Measurements: 3.5 x 1.8 x 2.2 centimeters = volume: 7 mL. Normal
appearance/no adnexal mass.

Pulsed Doppler evaluation of both ovaries demonstrates normal
low-resistance arterial and venous waveforms.

Other findings

No pelvic free fluid.
IMPRESSION: 1. Negative for ovarian torsion and normal ultrasound appearance of
the pelvis aside from a possible small, 12 mm intramural fibroid.
2. IUD position appears stable since 3838 and within normal limits.

## 2020-08-16 ENCOUNTER — Ambulatory Visit: Payer: Self-pay | Admitting: Family Medicine

## 2020-08-16 NOTE — Telephone Encounter (Signed)
Attempted 3rd call to call patient back to review right side of arm swelling and tenderness in neck and no answer, left voicemail to call clinic. appt scheduled for 08/17/20.

## 2020-08-16 NOTE — Telephone Encounter (Signed)
right side arm is swollen and tenderness in neck,seeking clinical advice prior to scheduled appointment on 08/17/2020.      2nd attempt to call patient for clinical advice.

## 2020-08-16 NOTE — Telephone Encounter (Signed)
See previous note

## 2020-08-16 NOTE — Telephone Encounter (Signed)
Message from Elliot Gault sent at 08/16/2020 8:07 AM EST  Summary: Clinical Advice    right side arm is swollen and tenderness in neck,seeking clinical advice prior to scheduled appointment on 08/17/2020.

## 2020-08-17 ENCOUNTER — Ambulatory Visit (INDEPENDENT_AMBULATORY_CARE_PROVIDER_SITE_OTHER): Payer: BC Managed Care – PPO | Admitting: Family Medicine

## 2020-08-17 ENCOUNTER — Encounter: Payer: Self-pay | Admitting: Family Medicine

## 2020-08-17 ENCOUNTER — Other Ambulatory Visit: Payer: Self-pay

## 2020-08-17 VITALS — BP 134/88 | HR 95 | Temp 99.2°F | Resp 18 | Ht 63.0 in | Wt 170.8 lb

## 2020-08-17 DIAGNOSIS — T148XXA Other injury of unspecified body region, initial encounter: Secondary | ICD-10-CM | POA: Insufficient documentation

## 2020-08-17 DIAGNOSIS — M62838 Other muscle spasm: Secondary | ICD-10-CM | POA: Diagnosis not present

## 2020-08-17 DIAGNOSIS — F5104 Psychophysiologic insomnia: Secondary | ICD-10-CM | POA: Diagnosis not present

## 2020-08-17 DIAGNOSIS — G5601 Carpal tunnel syndrome, right upper limb: Secondary | ICD-10-CM | POA: Diagnosis not present

## 2020-08-17 DIAGNOSIS — F410 Panic disorder [episodic paroxysmal anxiety] without agoraphobia: Secondary | ICD-10-CM | POA: Diagnosis not present

## 2020-08-17 LAB — COMPLETE METABOLIC PANEL WITH GFR
AG Ratio: 1.6 (calc) (ref 1.0–2.5)
ALT: 6 U/L (ref 6–29)
AST: 11 U/L (ref 10–30)
Albumin: 4.6 g/dL (ref 3.6–5.1)
Alkaline phosphatase (APISO): 60 U/L (ref 31–125)
BUN: 13 mg/dL (ref 7–25)
CO2: 26 mmol/L (ref 20–32)
Calcium: 9.5 mg/dL (ref 8.6–10.2)
Chloride: 109 mmol/L (ref 98–110)
Creat: 0.66 mg/dL (ref 0.50–1.10)
GFR, Est African American: 129 mL/min/{1.73_m2} (ref >=60)
GFR, Est Non African American: 111 mL/min/{1.73_m2} (ref >=60)
Globulin: 2.8 g/dL (calc) (ref 1.9–3.7)
Glucose, Bld: 81 mg/dL (ref 65–99)
Potassium: 3.9 mmol/L (ref 3.5–5.3)
Sodium: 140 mmol/L (ref 135–146)
Total Bilirubin: 0.5 mg/dL (ref 0.2–1.2)
Total Protein: 7.4 g/dL (ref 6.1–8.1)

## 2020-08-17 LAB — CBC WITH DIFFERENTIAL/PLATELET
Absolute Monocytes: 319 cells/uL (ref 200–950)
Basophils Absolute: 30 cells/uL (ref 0–200)
Basophils Relative: 0.4 %
Eosinophils Absolute: 23 cells/uL (ref 15–500)
Eosinophils Relative: 0.3 %
HCT: 39.7 % (ref 35.0–45.0)
Hemoglobin: 13.2 g/dL (ref 11.7–15.5)
Lymphs Abs: 1695 cells/uL (ref 850–3900)
MCH: 29.9 pg (ref 27.0–33.0)
MCHC: 33.2 g/dL (ref 32.0–36.0)
MCV: 90 fL (ref 80.0–100.0)
MPV: 8.8 fL (ref 7.5–12.5)
Monocytes Relative: 4.2 %
Neutro Abs: 5533 cells/uL (ref 1500–7800)
Neutrophils Relative %: 72.8 %
Platelets: 380 10*3/uL (ref 140–400)
RBC: 4.41 10*6/uL (ref 3.80–5.10)
RDW: 12 % (ref 11.0–15.0)
Total Lymphocyte: 22.3 %
WBC: 7.6 10*3/uL (ref 3.8–10.8)

## 2020-08-17 LAB — APTT: aPTT: 27 s (ref 23–32)

## 2020-08-17 LAB — PROTIME-INR
INR: 1
Prothrombin Time: 10.6 s (ref 9.0–11.5)

## 2020-08-17 MED ORDER — MIRTAZAPINE 7.5 MG PO TABS: ORAL_TABLET | ORAL | 1 refills | Status: DC

## 2020-08-17 MED ORDER — CYCLOBENZAPRINE HCL 10 MG PO TABS: 10.0000 mg | ORAL_TABLET | Freq: Three times a day (TID) | ORAL | 0 refills | Status: DC | PRN

## 2020-08-17 MED ORDER — HYDROXYZINE HCL 25 MG PO TABS: 25.0000 mg | ORAL_TABLET | Freq: Two times a day (BID) | ORAL | 2 refills | Status: DC | PRN

## 2020-08-17 MED ORDER — ESCITALOPRAM OXALATE 20 MG PO TABS: 20.0000 mg | ORAL_TABLET | Freq: Every day | ORAL | 1 refills | Status: DC

## 2020-08-17 NOTE — Assessment & Plan Note (Signed)
Right sided carpal tunnel, discussed bracing, ergonomic wrist support and looking into chiropractor for possible adjustment.  Can look into orthopedics referral if does not improve with conservative management.  Pt in agreement with plan.

## 2020-08-17 NOTE — Assessment & Plan Note (Signed)
Currently stable and well controlled with hydroxyzine 25mg  BID and escitalopram 20mg  daily.  Will continue.

## 2020-08-17 NOTE — Patient Instructions (Addendum)
Have your labs drawn and we will contact you with the results  As we discussed, you have carpal tunnel, you can look into meeting with a chiropractor to help with adjustments, you also may find relief with using a brace (Cock-Up Brace) for proper wrist alignment.  I have sent in a prescription for cyclobenzaprine to take 5-10mg  (1/2 to 1 tablet) up to 3x per day for your cervical paraspinal neck spasms  All medications have been sent to your pharmacy on file  We will plan to see you back in 6 months for anxiety follow up visit  You will receive a survey after today's visit either digitally by e-mail or paper by USPS mail. Your experiences and feedback matter to [place].  Please respond so we know how we are doing as we provide care for you.  Call us with any questions/concerns/needs.  It is my goal to be available to you for your health concerns.  Thanks for choosing me to be a partner in your healthcare needs!  Charlaine Dalton, FNP-C Family Nurse Practitioner [facility] Phone: [phone]

## 2020-08-17 NOTE — Assessment & Plan Note (Signed)
Currently stable with mirtazapine 7.5mg .  Will continue.

## 2020-08-17 NOTE — Progress Notes (Signed)
Subjective:    Patient ID: Tammy Bennett, female    DOB: [birth year], 40 y.o.   MRN: [id number]  Tammy Bennett is a 40 y.o. female presenting on 08/17/2020 for Edema (Right hand swelling with difficulty with range of motion x 1 day. With history off carpal tunnel.) and Bleeding/Bruising (Intermittent bruising all over x 2 weeks. Bruise on the Rt outer calf x 2 days. Pt doesn't recall any injury to the leg. )   HPI  [name] presents to clinic for evaluation of right hand swelling x 1 day with difficulty with ROM, reports has been using ice and has had an improvement in symptoms, has continued numbness in finger tips at times.  Reports does do an increased amount of computer work.  Has concerns for increased bruising over the past 2 weeks with current bruise on right outer calf and left upper arm in various stages of healing.  Denies any fall, injury, trauma, hematuria, epistaxis, bleeding from gums, blood in stool, dizziness, lightheadedness, or clotting/anemia issue in the past.  Depression screen [facility] 2/9 07/04/2019 01/25/2019 12/20/2018  Decreased Interest 1 2 0  Down, Depressed, Hopeless 1 2 0  PHQ - 2 Score 2 4 0  Altered sleeping 1 2 1   Tired, decreased energy 1 2 1   Change in appetite 1 2 1   Feeling bad or failure about yourself  1 1 0  Trouble concentrating 1 2 1   Moving slowly or fidgety/restless 0 0 0  Suicidal thoughts 0 0 0  PHQ-9 Score 7 13 4   Difficult doing work/chores Very difficult Somewhat difficult Not difficult at all    Social History   Tobacco Use  . Smoking status: Never Smoker  . Smokeless tobacco: Never Used  Vaping Use  . Vaping Use: Never used  Substance Use Topics  . Alcohol use: Yes    Comment: OCC  . Drug use: Not Currently    Types: Marijuana    Comment: none in years    Review of Systems  Constitutional: Negative.   HENT: Negative.   Eyes: Negative.   Respiratory: Negative.   Cardiovascular: Negative.   Gastrointestinal: Negative.    Endocrine: Negative.   Genitourinary: Negative.   Musculoskeletal: Positive for arthralgias and joint swelling. Negative for back pain, gait problem, myalgias, neck pain and neck stiffness.  Skin: Negative.   Allergic/Immunologic: Negative.   Neurological: Negative.   Hematological: Negative for adenopathy. Bruises/bleeds easily.  Psychiatric/Behavioral: Negative.    Per HPI unless specifically indicated above     Objective:    BP 134/88 (BP Location: Right Arm, Patient Position: Sitting, Cuff Size: Normal)   Pulse 95   Temp 99.2 F (37.3 C) (Oral)   Resp 18   Ht 5\' 3"  (1.6 m)   Wt 170 lb 12.8 oz (77.5 kg)   SpO2 100%   BMI 30.26 kg/m   Wt Readings from Last 3 Encounters:  08/17/20 170 lb 12.8 oz (77.5 kg)  03/26/20 150 lb (68 kg)  05/30/19 165 lb 5.5 oz (75 kg)    Physical Exam Vitals and nursing note reviewed.  Constitutional:      General: She is not in acute distress.    Appearance: Normal appearance. She is well-developed and well-groomed. She is obese. She is not ill-appearing or toxic-appearing.  HENT:     Head: Normocephalic and atraumatic.     Nose:     Comments: is in place, covering mouth and nose. Eyes:     General: Lids are  normal. Vision grossly intact.        Right eye: No discharge.        Left eye: No discharge.     Extraocular Movements: Extraocular movements intact.     Conjunctiva/sclera: Conjunctivae normal.     Pupils: Pupils are equal, round, and reactive to light.  Cardiovascular:     Pulses: Normal pulses.  Pulmonary:     Effort: Pulmonary effort is normal. No respiratory distress.  Musculoskeletal:        General: Tenderness present.     Cervical back: Spasms and tenderness present. No signs of trauma. No pain with movement. Normal range of motion.     Thoracic back: Normal.     Lumbar back: Normal.     Comments: + Phalens & + Tinels right hand.  Skin:    General: Skin is warm and dry.     Capillary Refill: Capillary refill  takes less than 2 seconds.     Findings: Bruising present.       Neurological:     General: No focal deficit present.     Mental Status: She is alert and oriented to person, place, and time.  Psychiatric:        Attention and Perception: Attention and perception normal.        Mood and Affect: Mood and affect normal.        Speech: Speech normal.        Behavior: Behavior normal. Behavior is cooperative.        Thought Content: Thought content normal.        Cognition and Memory: Cognition and memory normal.        Judgment: Judgment normal.    Results for orders placed or performed during the hospital encounter of 03/26/20  Lipase, blood  Result Value Ref Range   Lipase 22 11 - 51 U/L  Comprehensive metabolic panel  Result Value Ref Range   Sodium 137 135 - 145 mmol/L   Potassium 3.6 3.5 - 5.1 mmol/L   Chloride 105 98 - 111 mmol/L   CO2 21 (L) 22 - 32 mmol/L   Glucose, Bld 87 70 - 99 mg/dL   BUN 11 6 - 20 mg/dL   Creatinine, Ser 6.23 0.44 - 1.00 mg/dL   Calcium 9.2 8.9 - 76.2 mg/dL   Total Protein 7.9 6.5 - 8.1 g/dL   Albumin 4.6 3.5 - 5.0 g/dL   AST 16 15 - 41 U/L   ALT 10 0 - 44 U/L   Alkaline Phosphatase 58 38 - 126 U/L   Total Bilirubin 0.7 0.3 - 1.2 mg/dL   GFR calc non Af Amer >60 >60 mL/min   GFR calc Af Amer >60 >60 mL/min   Anion gap 11 5 - 15  CBC  Result Value Ref Range   WBC 9.0 4.0 - 10.5 K/uL   RBC 4.32 3.87 - 5.11 MIL/uL   Hemoglobin 12.8 12.0 - 15.0 g/dL   HCT 83.1 51.7 - 61.6 %   MCV 89.4 80.0 - 100.0 fL   MCH 29.6 26.0 - 34.0 pg   MCHC 33.2 30.0 - 36.0 g/dL   RDW 07.3 71.0 - 62.6 %   Platelets 327 150 - 400 K/uL   nRBC 0.0 0.0 - 0.2 %  Urinalysis, Complete w Microscopic  Result Value Ref Range   Color, Urine YELLOW (A) YELLOW   APPearance HAZY (A) CLEAR   Specific Gravity, Urine 1.021 1.005 - 1.030   pH 7.0 5.0 -  8.0   Glucose, UA NEGATIVE NEGATIVE mg/dL   Hgb urine dipstick LARGE (A) NEGATIVE   Bilirubin Urine NEGATIVE NEGATIVE    Ketones, ur 20 (A) NEGATIVE mg/dL   Protein, ur NEGATIVE NEGATIVE mg/dL   Nitrite NEGATIVE NEGATIVE   Leukocytes,Ua NEGATIVE NEGATIVE   RBC / HPF 21-50 0 - 5 RBC/hpf   WBC, UA 0-5 0 - 5 WBC/hpf   Bacteria, UA NONE SEEN NONE SEEN   Squamous Epithelial / LPF 0-5 0 - 5   Mucus PRESENT       Assessment & Plan:   Problem List Items Addressed This Visit      Nervous and Auditory   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  Can look into orthopedics referral if does not improve with conservative management.  Pt in agreement with plan.      Relevant Medications   hydrOXYzine (ATARAX/VISTARIL) 25 MG tablet   mirtazapine (REMERON) 7.5 MG tablet   escitalopram (LEXAPRO) 20 MG tablet   cyclobenzaprine (FLEXERIL) 10 MG tablet     Other   Severe anxiety with panic    Currently stable and well controlled with hydroxyzine 25mg  BID and escitalopram 20mg  daily.  Will continue.      Relevant Medications   hydrOXYzine (ATARAX/VISTARIL) 25 MG tablet   mirtazapine (REMERON) 7.5 MG tablet   escitalopram (LEXAPRO) 20 MG tablet   Psychophysiological insomnia    Currently stable with mirtazapine 7.5mg .  Will continue.      Relevant Medications   mirtazapine (REMERON) 7.5 MG tablet   Muscle spasm    Right sided cervical paraspinal muscle tenderness, will treat with cyclobenzaprine 5-10mg  TID PRN for muscle tenderness/spasm.  Discussed good ergonomics, especially since doing increased computer work.  To take break every hour for 5-10 minutes to stretch.      Relevant Medications   cyclobenzaprine (FLEXERIL) 10 MG tablet   Bruising    Right calf, left upper arm with bruising in various stages of healing.  Denies nose bleeds, hematuria, blood in stool, bleeding gums when brushing teeth, heavy menses, dizziness, lightheadedness, or history of clotting concerns.  Will  have STAT labs drawn for evaluation.      Relevant Orders   CBC with Differential   COMPLETE METABOLIC PANEL WITH GFR   Protime-INR   PTT      Meds ordered this encounter  Medications  . hydrOXYzine (ATARAX/VISTARIL) 25 MG tablet    Sig: Take 1 tablet (25 mg total) by mouth 2 (two) times daily as needed for anxiety. May take 1 additional pill per dose if acute panic attack. Max 3 pills in 24 hours    Dispense:  30 tablet    Refill:  2  . mirtazapine (REMERON) 7.5 MG tablet    Sig: TAKE 1 TABLET(7.5 MG) BY MOUTH AT BEDTIME    Dispense:  90 tablet    Refill:  1  . escitalopram (LEXAPRO) 20 MG tablet    Sig: Take 1 tablet (20 mg total) by mouth daily. Start with half tablet once daily (10mg  dose) for 2-4 weeks, then increase to whole tablet.    Dispense:  90 tablet    Refill:  1  . cyclobenzaprine (FLEXERIL) 10 MG tablet    Sig: Take 1 tablet (10 mg total) by mouth 3 (three) times daily as needed for muscle spasms.    Dispense:  30 tablet  Refill:  0    Follow up plan: Return in about 6 months (around 02/14/2021) for Anxiety f/u.   Harlin Rain, [facility] 08/17/2020, 12:11 PM

## 2020-08-17 NOTE — Assessment & Plan Note (Signed)
Right sided cervical paraspinal muscle tenderness, will treat with cyclobenzaprine 5-10mg  TID PRN for muscle tenderness/spasm.  Discussed good ergonomics, especially since doing increased computer work.  To take break every hour for 5-10 minutes to stretch.

## 2020-08-17 NOTE — Assessment & Plan Note (Signed)
Right calf, left upper arm with bruising in various stages of healing.  Denies nose bleeds, hematuria, blood in stool, bleeding gums when brushing teeth, heavy menses, dizziness, lightheadedness, or history of clotting concerns.  Will have STAT labs drawn for evaluation.

## 2020-10-01 DIAGNOSIS — M67431 Ganglion, right wrist: Secondary | ICD-10-CM | POA: Diagnosis not present

## 2020-10-01 DIAGNOSIS — M25531 Pain in right wrist: Secondary | ICD-10-CM | POA: Diagnosis not present

## 2020-10-04 ENCOUNTER — Ambulatory Visit: Payer: BC Managed Care – PPO | Admitting: Family Medicine

## 2020-10-23 DIAGNOSIS — M67431 Ganglion, right wrist: Secondary | ICD-10-CM | POA: Diagnosis not present

## 2020-10-23 DIAGNOSIS — S63637A Sprain of interphalangeal joint of left little finger, initial encounter: Secondary | ICD-10-CM | POA: Diagnosis not present

## 2020-10-23 DIAGNOSIS — G5603 Carpal tunnel syndrome, bilateral upper limbs: Secondary | ICD-10-CM | POA: Diagnosis not present

## 2020-11-16 DIAGNOSIS — M2550 Pain in unspecified joint: Secondary | ICD-10-CM | POA: Diagnosis not present

## 2020-11-16 LAB — CBC AND DIFFERENTIAL
HCT: 38 (ref 36–46)
Hemoglobin: 12.9 (ref 12.0–16.0)
Platelets: 334 (ref 150–399)
WBC: 10.1

## 2020-11-16 LAB — RHEUMATOID FACTOR
Anti-CCP Ab, IgG & IgA (RDL): 6 (ref 0–19)
CRP: 18 — AB (ref 0–10)
Erythrocyte Sed Rate: 33 — AB (ref 0–32)
Lupus Anticoagulant: NOT DETECTED
Rheumatoid Factor (IgA): <10

## 2020-11-16 LAB — CBC: RBC: 4.29 (ref 3.87–5.11)

## 2020-11-22 DIAGNOSIS — R202 Paresthesia of skin: Secondary | ICD-10-CM | POA: Diagnosis not present

## 2020-11-26 ENCOUNTER — Ambulatory Visit: Payer: Self-pay | Admitting: *Deleted

## 2020-11-26 NOTE — Telephone Encounter (Signed)
Pt called in c/o her left breast and nipple being very swollen and sore.   The nipple is about 5 times larger than the right one.   This has happened to her right breast and nipple several years ago.   It's from having her nipples pierced.   The piercing's have been removed for several years since having this problem in the right breast.  See triage notes.    Has an appt with Dr. Althea Charon for 11/27/2020 at 11:20.   Protocol is to be seen within 24 hours.   She prefers to be seen tomorrow due to babysitting arrangements.   I sent my triage notes to [facility] for Dr. Althea Charon     Reason for Disposition . [1] Breast looks infected (spreading redness, feels hot or painful to touch) AND [2] no fever  Answer Assessment - Initial Assessment Questions 1. SYMPTOM: "What's the main symptom you're concerned about?"  (e.g., lump, pain, rash, nipple discharge)     Left breast is swollen and sore and the left nipple is 5 times larger than the right one.  She had her nipples pierced in the past about 12 years ago.   She had this same thing happen in her right breast about 8 yrs ago when she was living in [place].   They ended up surgically removing a cyst in her right nipple and has not had problems with it since.  The piercing's have been removed for years now.  This all started with her right breast when she got pregnant.   They had to wait and do the nipple surgery after her pregnancy.   When she has her periods it seems to aggravate this problem until the cyst was removed.   Now she is having the same problem with the left breast and nipple.  The dr. Effie Berkshire the piercing did something with the ducts that caused the cyst.     The left breast is now swollen, sore and the nipple is 5 times the size of the right one.   She is using hot packs and Tylenol for the discomfort but it's not helping much.   "I stayed home yesterday so I didn't have to wear a shirt I'm so sore".   "I can't wear a  bra or shirt".   Anything touching it hurts.   "I'm also having a little pus coming out of the nipple like it did the prior time".  The agent was able to make an appt for 11/27/2020.  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   She has another dr appt tomorrow and wants to only miss one day of work so she wants to keep the appt for tomorrow.   "I will be ok until then".   "I've been through this before".    2. LOCATION: "Where is the left breast and nipple located?"     Very swollen and sore 3. ONSET: "When did Swelling started Saturday  start?"     Swelling and soreness of breast and nipple 4. PRIOR HISTORY: "Do you have any history of prior problems with your breasts?" (e.g., lumps, cancer, fibrocystic breast disease)     Yes.   See above. Same thing happened years ago in her right breast as a result of the nipple piercing's.  They ended up removing a cyst from her nipple and it resolved the problem.   The piercing's have been removed  for many years now. 5. CAUSE: "What do you think is causing this symptom?"     From having my nipples pierced years ago.   Then when I got pregnant this problem started in my right breast. 6. OTHER SYMPTOMS: "Do you have any other symptoms?" (e.g., fever, breast pain, redness or rash, nipple discharge)     Having a small amount of tan pus coming from the left nipple.   Using Tylenol and hot packs for the pain and swelling. 7. PREGNANCY-BREASTFEEDING: "Is there any chance you are pregnant?" "When was your last menstrual period?" "Are you breastfeeding?"     Not pregnant or breast feeding.   "My period seems to cause this to flare up at times but this time it's really bad".  Protocols used: BREAST [facility]

## 2020-11-27 ENCOUNTER — Encounter: Payer: Self-pay | Admitting: Family Medicine

## 2020-11-27 ENCOUNTER — Other Ambulatory Visit: Payer: Self-pay

## 2020-11-27 ENCOUNTER — Ambulatory Visit: Payer: BC Managed Care – PPO | Admitting: Family Medicine

## 2020-11-27 VITALS — BP 119/74 | HR 99 | Temp 97.7°F | Ht 62.0 in | Wt 180.2 lb

## 2020-11-27 DIAGNOSIS — N611 Abscess of the breast and nipple: Secondary | ICD-10-CM | POA: Diagnosis not present

## 2020-11-27 DIAGNOSIS — M67431 Ganglion, right wrist: Secondary | ICD-10-CM | POA: Diagnosis not present

## 2020-11-27 DIAGNOSIS — N61 Mastitis without abscess: Secondary | ICD-10-CM

## 2020-11-27 MED ORDER — DOXYCYCLINE HYCLATE 100 MG PO TABS: 100.0000 mg | ORAL_TABLET | Freq: Two times a day (BID) | ORAL | 0 refills | Status: DC

## 2020-11-27 MED ORDER — HYDROCODONE-ACETAMINOPHEN 5-325 MG PO TABS: 1.0000 | ORAL_TABLET | Freq: Four times a day (QID) | ORAL | 0 refills | Status: DC | PRN

## 2020-11-27 NOTE — Progress Notes (Addendum)
Subjective:    Patient ID: Tammy Bennett, female    DOB: Feb 22, 1981, 40 y.o.   MRN: [id number]  Tammy Bennett is a 40 y.o. female presenting on 11/27/2020 for Breast Pain (Pt notice 4/16 swelling and pain. Pt has her nipples pierced, notice yellow discharge from her piercing. Pt notice a lump on her left armpit and states it hurts./Pt had a mammogram 6 years ago.)   HPI   Left Breast Nipple Swelling / Abscess vs Mastitis / Lymphadenopathy  Prior problem R breast / nipple with abscess recurrent mastitis. Previously in [place] she had 1-2 years of wound packing and draining the cyst but never resolved until had it excised - years ago.  Recent history now  Telemedicine with me 12/2018, referred to [facility], we did initial trial on Augmentin antibiotic, limited results, they referred to General Surgery and she had Right nipple cyst excision by Dr Bary Castilla in 01/2019. This resolved the problem.  Now new issue, LEFT Breast similar problem. New onset now past 2-3 days with Left nipple swelling and pain, and yellow discharge. Admits pain radiating into Left axillary and sternum. Has a constant throbbing pain, and some worsening, worse with touch or movement.  History of recent labs From [facility] - Dr Leretha Pol with -  ESR / CRP elevated, WBC. Otherwise Lupus anticoagulant and RF negative.  She admits her menstrual cycle may be triggering the issue. Onset period  Had not returned to OBGYN recently.   Depression screen [facility] 2/9 07/04/2019 01/25/2019 12/20/2018  Decreased Interest 1 2 0  Down, Depressed, Hopeless 1 2 0  PHQ - 2 Score 2 4 0  Altered sleeping _0 Tired, decreased energy _1 Change in appetite _2 Feeling bad or failure about yourself  1 1 0  Trouble concentrating _3 Moving slowly or fidgety/restless 0 0 0  Suicidal thoughts 0 0 0  PHQ-9 Score _4 Difficult doing work/chores Very difficult Somewhat difficult Not difficult at all    Social History    Tobacco Use  . Smoking status: Never Smoker  . Smokeless tobacco: Never Used  Vaping Use  . Vaping Use: Never used  Substance Use Topics  . Alcohol use: Yes    Comment: OCC  . Drug use: Not Currently    Types: Marijuana    Comment: none in years    Review of Systems Per HPI unless specifically indicated above     Objective:    BP 119/74 (BP Location: Left Arm, Patient Position: Sitting, Cuff Size: Normal)   Pulse 99   Temp 97.7 F (36.5 C) (Temporal)   Ht _5  (1.575 m)   Wt 180 lb 3.2 oz (81.7 kg)   SpO2 100%   BMI 32.96 kg/m   Wt Readings from Last 3 Encounters:  11/27/20 180 lb 3.2 oz (81.7 kg)  08/17/20 170 lb 12.8 oz (77.5 kg)  03/26/20 150 lb (68 kg)    Physical Exam Vitals and nursing note reviewed.  Constitutional:      General: She is not in acute distress.    Appearance: She is well-developed. She is not diaphoretic.     Comments: Well-appearing, comfortable, cooperative  HENT:     Head: Normocephalic and atraumatic.  Cardiovascular:     Rate and Rhythm: Normal rate.  Pulmonary:     Effort: Pulmonary effort is normal.  Chest:  Breasts:     Right: No axillary  adenopathy or supraclavicular adenopathy.     Left: Axillary adenopathy present. No supraclavicular adenopathy.    Lymphadenopathy:     Upper Body:     Right upper body: No supraclavicular or axillary adenopathy.     Left upper body: Axillary adenopathy present. No supraclavicular adenopathy.  Skin:    General: Skin is warm and dry.     Findings: Erythema and lesion (Exam chaperoned by Rosalyn Charters CMA Student - Left breast with palpable induration abscess deep to nipple with 0.5 cm ulceration with slight scab and some discharge, deeper palpable indurated area under nipple, has some extending erythema. tender) present. No rash.  Neurological:     Mental Status: She is alert and oriented to person, place, and time.  Psychiatric:        Behavior: Behavior normal.     Comments: Well  groomed, good eye contact, normal speech and thoughts       Results for orders placed or performed in visit on 11/27/20  CBC and differential  Result Value Ref Range   Hemoglobin 12.9 12.0 - 16.0   HCT 38 36 - 46   Platelets 334 150 - 399   WBC 10.1   CBC  Result Value Ref Range   RBC 4.29 3.87 - 5.11  Rheumatoid factor  Result Value Ref Range   Rheumatoid Factor (IgA) <10.0    Lupus Anticoagulant No lupus anticoagulant detected.    CRP 18 (A) 0 - 10   Erythrocyte Sed Rate 33 (A) 0 - 32   Anti-CCP Ab, IgG & IgA (RDL) 6 0 - 19      Assessment & Plan:   Problem List Items Addressed This Visit   None   Visit Diagnoses    Acute mastitis of left breast    -  Primary   Relevant Medications   doxycycline (VIBRA-TABS) 100 MG tablet   HYDROcodone-acetaminophen (NORCO/VICODIN) 5-325 MG tablet   Other Relevant Orders   Ambulatory referral to General Surgery   Abscess of nipple, left       Relevant Medications   doxycycline (VIBRA-TABS) 100 MG tablet   HYDROcodone-acetaminophen (NORCO/VICODIN) 5-325 MG tablet   Other Relevant Orders   Ambulatory referral to General Surgery     Current acute L nipple mastitis w/ abscess under nipple Associated LAD  Proceed with Doxycycline antibiotic course today Start taking Doxycycline antibiotic 115m twice daily for 10 days. Take with full glass of water and stay upright for at least 30 min after taking, may be seated or standing, but should NOT lay down. This is just a safety precaution, if this medicine does not go all the way down throat well it could cause some burning discomfort to throat and esophagus.  Short term Hydrocodone PRN for pain. PDMP reviewed.  Referral to [facility] for excision/cyst or I&D  With history of recurrent breast / nipple abscess, now with Left breast nipple abscess and some mastitis, history of nipple piercing >10-12 years ago, she had had similar problem associated with her Right  breast/nipple excision by surgery back in 01/2020.   Orders Placed This Encounter  Procedures  . CBC and differential    This external order was created through the Results Console.  . CBC    This external order was created through the Results Console.  . Rheumatoid factor    This order was created through External Result Entry  . Ambulatory referral to General Surgery    Referral Priority:   Routine  Referral Type:   Surgical    Referral Reason:   Specialty Services Required    Requested Specialty:   General Surgery    Number of Visits Requested:   1     Meds ordered this encounter  Medications  . doxycycline (VIBRA-TABS) 100 MG tablet    Sig: Take 1 tablet (100 mg total) by mouth 2 (two) times daily. For 10 days. Take with full glass of water, stay upright 30 min after taking.    Dispense:  20 tablet    Refill:  0  . HYDROcodone-acetaminophen (NORCO/VICODIN) 5-325 MG tablet    Sig: Take 1 tablet by mouth every 6 (six) hours as needed for moderate pain.    Dispense:  20 tablet    Refill:  0      Follow up plan: Return if symptoms worsen or fail to improve.   [name], [facility] 11/27/2020, 12:03 PM

## 2020-11-27 NOTE — Patient Instructions (Addendum)
Thank you for coming to the office today.  Start taking Doxycycline antibiotic 100mg  twice daily for 10 days. Take with full glass of water and stay upright for at least 30 min after taking, may be seated or standing, but should NOT lay down. This is just a safety precaution, if this medicine does not go all the way down throat well it could cause some burning discomfort to throat and esophagus.  Hydrocodone as needed for pain  Referral to General Surgery - stay tuned for apt.   [facility] Address: [address], [place], [place] Phone: [phone]   Please schedule a Follow-up Appointment to: Return if symptoms worsen or fail to improve.  If you have any other questions or concerns, please feel free to call the office or send a message through MyChart. You may also schedule an earlier appointment if necessary.  Additionally, you may be receiving a survey about your experience at our office within a few days to 1 week by e-mail or mail. We value your feedback.  [phone], DO [facility], [facility]

## 2020-11-28 ENCOUNTER — Encounter: Payer: Self-pay | Admitting: Surgery

## 2020-11-28 ENCOUNTER — Ambulatory Visit: Payer: BC Managed Care – PPO | Admitting: Surgery

## 2020-11-28 VITALS — BP 128/83 | HR 93 | Temp 98.3°F | Ht 62.0 in | Wt 178.4 lb

## 2020-11-28 DIAGNOSIS — N611 Abscess of the breast and nipple: Secondary | ICD-10-CM

## 2020-11-28 NOTE — Progress Notes (Signed)
11/28/2020  Reason for Visit:  Left breast abscess  Referring Provider:  Saralyn Pilar, DO  History of Present Illness: Tammy Bennett is a 40 y.o. female presenting for evaluation of a left breast abscess.  The patient has a history of prior right breast abscess at the nipple/areola complex.  She has a history of bilateral nipple piercings and she feels this was the reason for the initial abscess when she was in her 34s.  She had prior antibiotic treatments and also had an I&D with packing for the right breast, but she continued to recur.  Dr. Lemar Livings performed an excision of the involved cyst/abscess in 2020 and it never came back after that.  Now however, she's starting to have issues with the left breast, also in the nipple/areola complex.  Patient reports that about 4 days ago, she started developing pain, swelling, and redness in the left nipple, medial area.  The redness extended through the entire areola and part of the breast skin.  She tried doing warm compresses and the area started draining a day or two after.  She saw Dr. Althea Charon on 4/19 and was started on Doxycycline.  She reports that after the spontaneous drainage, and with now two doses of the antibiotic, the are feels much better, with less tenderness and much improved redness/swelling.  There is an area of the medial left nipple that she points had the drainage.  Given her prior history with the right breast, she's interested in proceeding with excision again.  She reports feeling very warm/hot but never took her temperature.  Denies any issues with the right breast.  Past Medical History: Past Medical History:  Diagnosis Date  . Allergy   . Anemia    DURING PREGNANCY  . Anxiety   . Depression   . Gastritis   . GERD (gastroesophageal reflux disease)   . IBS (irritable bowel syndrome)   . Murmur   . SVT (supraventricular tachycardia) (HCC)   . Tendonitis    ARM RIGHT     Past Surgical History: Past Surgical  History:  Procedure Laterality Date  . catheter abaltion to heart     Treated SVT as teenager  . CHOLECYSTECTOMY    . COLONOSCOPY WITH PROPOFOL N/A 05/30/2019   Procedure: COLONOSCOPY WITH PROPOFOL;  Surgeon: [name], MD;  Location: [facility] ENDOSCOPY;  Service: Gastroenterology;  Laterality: N/A;  . EVACUATION BREAST HEMATOMA Right 01/12/2019   Procedure: RIGHT EXCISION NIPPLE ABSCESS;  Surgeon: Earline Mayotte, MD;  Location: [facility] ORS;  Service: General;  Laterality: Right;  . TONSILLECTOMY    . TUBAL LIGATION      Home Medications: Prior to Admission medications   Medication Sig Start Date End Date Taking? Authorizing Provider  cetirizine (ZYRTEC) 10 MG tablet Take 1 tablet (10 mg total) by mouth daily. Patient taking differently: Take 10 mg by mouth every morning. 10/13/18  Yes Galen Manila, NP  doxycycline (VIBRA-TABS) 100 MG tablet Take 1 tablet (100 mg total) by mouth 2 (two) times daily. For 10 days. Take with full glass of water, stay upright 30 min after taking. 11/27/20  Yes Karamalegos, Netta Neat, DO  fluticasone (FLONASE) 50 MCG/ACT nasal spray Place 2 sprays into both nostrils daily. 10/13/18  Yes Galen Manila, NP  HYDROcodone-acetaminophen (NORCO/VICODIN) 5-325 MG tablet Take 1 tablet by mouth every 6 (six) hours as needed for moderate pain. 11/27/20  Yes Karamalegos, Netta Neat, DO  hydrOXYzine (ATARAX/VISTARIL) 25 MG tablet Take 1 tablet (25 mg total) by  mouth 2 (two) times daily as needed for anxiety. May take 1 additional pill per dose if acute panic attack. Max 3 pills in 24 hours 08/17/20  Yes Malfi, Jodelle Gross, FNP  levonorgestrel (MIRENA) 20 MCG/24HR IUD 1 each by Intrauterine route once.   Yes [provider]  mirtazapine (REMERON) 7.5 MG tablet TAKE 1 TABLET(7.5 MG) BY MOUTH AT BEDTIME 08/17/20  Yes Malfi, Jodelle Gross, FNP    Allergies: Allergies  Allergen Reactions  . Bacitracin Rash    Social History:  reports that she has never smoked. She  has never used smokeless tobacco. She reports current alcohol use. She reports previous drug use. Drug: Marijuana.   Family History: Family History  Problem Relation Age of Onset  . Stroke Mother   . Stomach cancer Maternal Grandmother   . Leukemia Maternal Grandfather   . Pancreatic cancer Paternal Grandmother   . Healthy Brother   . Sickle cell anemia Half-Sister   . Crohn's disease Half-Sister   . Ulcerative colitis Half-Sister   . Thyroid disease Cousin   . Breast cancer Maternal Aunt 48    Review of Systems: Review of Systems  Constitutional: Negative for chills and fever.  HENT: Negative for hearing loss.   Respiratory: Negative for cough.   Cardiovascular: Negative for chest pain.  Gastrointestinal: Negative for nausea and vomiting.  Genitourinary: Negative for dysuria.  Musculoskeletal: Negative for myalgias.  Skin:       Left breast abscess/drainage  Neurological: Negative for dizziness.  Psychiatric/Behavioral: Negative for depression.    Physical Exam BP 128/83   Pulse 93   Temp 98.3 F (36.8 C) (Oral)   Ht 5\' 2"  (1.575 m)   Wt 178 lb 6.4 oz (80.9 kg)   LMP 11/01/2020   SpO2 97%   BMI 32.63 kg/m  CONSTITUTIONAL: No acute distress, well nourished HEENT:  Normocephalic, atraumatic, extraocular motion intact. NECK: Trachea is midline, and there is no jugular venous distension.  RESPIRATORY:  Lungs are clear, and breath sounds are equal bilaterally. Normal respiratory effort without pathologic use of accessory muscles. CARDIOVASCULAR: Heart is regular without murmurs, gallops, or rubs. BREAST:  Right breast s/p prior excision in the lateral aspect of the right nipple/areola.  This is well healed.  No palpable masses, skin changes, or nipple changes.  Left breast with a small wound that's closing/healing in the medial aspect of the nipple areola, associated with underlying small area of firmness.  There is no significant erythema and no significant  induration. MUSCULOSKELETAL:  Normal muscle strength and tone in all four extremities.  No peripheral edema or cyanosis. SKIN: Skin turgor is normal. There are no pathologic skin lesions.  NEUROLOGIC:  Motor and sensation is grossly normal.  Cranial nerves are grossly intact. PSYCH:  Alert and oriented to person, place and time. Affect is normal.  Laboratory Analysis: No results found for this or any previous visit (from the past 24 hour(s)).  Imaging: No results found.  Assessment and Plan: This is a 40 y.o. female with new left breast small abscess, with spontaneous drainage.  --The patient feels better after spontaneous drainage of the abscess.  There's no significant erythema or induration at this point.  For now I think it's appropriate to continue current management with warm compresses and continue the prescription for Doxycycline.  The patient will follow up with me in 2 weeks to assess the wound and infection.  If the abscess has healed, then we'll schedule her for excision of this area as  Dr. Lemar Livings did on the right side.  She's in agreement with this plan and would like to proceed with surgery in the near future so this does not keep happening again like it did on the right breast. --Follow up 2 weeks.  All questions have been answered.  Face-to-face time spent with the patient and care providers was 60 minutes, with more than 50% of the time spent counseling, educating, and coordinating care of the patient.     Howie Ill, MD [facility]

## 2020-11-28 NOTE — Patient Instructions (Addendum)
Continue your complete course of antibiotics to treat infection.If needed try warm compress. We will see you back in 2 weeks. See your next appointment listed below. Please give [place] a call if you have any questions or concerns.

## 2020-12-07 ENCOUNTER — Encounter: Payer: Self-pay | Admitting: Internal Medicine

## 2020-12-07 ENCOUNTER — Ambulatory Visit: Payer: BC Managed Care – PPO | Admitting: Internal Medicine

## 2020-12-07 ENCOUNTER — Other Ambulatory Visit: Payer: Self-pay

## 2020-12-07 VITALS — BP 115/73 | HR 91 | Temp 97.7°F | Resp 17 | Ht 62.0 in | Wt 175.8 lb

## 2020-12-07 DIAGNOSIS — R3915 Urgency of urination: Secondary | ICD-10-CM | POA: Diagnosis not present

## 2020-12-07 DIAGNOSIS — R3989 Other symptoms and signs involving the genitourinary system: Secondary | ICD-10-CM

## 2020-12-07 DIAGNOSIS — R109 Unspecified abdominal pain: Secondary | ICD-10-CM

## 2020-12-07 DIAGNOSIS — R3911 Hesitancy of micturition: Secondary | ICD-10-CM

## 2020-12-07 DIAGNOSIS — R350 Frequency of micturition: Secondary | ICD-10-CM | POA: Diagnosis not present

## 2020-12-07 DIAGNOSIS — R3129 Other microscopic hematuria: Secondary | ICD-10-CM

## 2020-12-07 LAB — POCT URINALYSIS DIPSTICK
Bilirubin, UA: NEGATIVE
Glucose, UA: NEGATIVE
Ketones, UA: NEGATIVE
Leukocytes, UA: NEGATIVE
Nitrite, UA: NEGATIVE
Protein, UA: NEGATIVE
Spec Grav, UA: 1.02 (ref 1.010–1.025)
Urobilinogen, UA: 0.2 E.U./dL
pH, UA: 5 (ref 5.0–8.0)

## 2020-12-07 MED ORDER — TAMSULOSIN HCL 0.4 MG PO CAPS: 0.4000 mg | ORAL_CAPSULE | Freq: Every day | ORAL | 3 refills | Status: DC

## 2020-12-07 MED ORDER — HYDROCODONE-ACETAMINOPHEN 5-325 MG PO TABS: 1.0000 | ORAL_TABLET | Freq: Three times a day (TID) | ORAL | 0 refills | Status: DC | PRN

## 2020-12-07 NOTE — Patient Instructions (Signed)

## 2020-12-07 NOTE — Progress Notes (Signed)
HPI  Pt presents to the clinic today with c/o urgency, hesitancy, bladder pressure and flank pain. She reports this started a few days ago. She denies frequency, dysuria or blood in her urine. She denies vaginal discharge, odor, itching or abnormal bleeding. She has had some nausea due to the pain but denies fever, chills or body aches. She has been taking Doxycycline 100 mg and Hydrocodone 5-325 mg for mastitis without any improvement in symptoms. She is not currently on her menses, has an IUD. She has had similar symptoms prior, CT renal stone study at that time did not show a kidney stone but they felt like that was the issue. She has not seen a urologist in the past.  Review of Systems  Past Medical History:  Diagnosis Date  . Allergy   . Anemia    DURING PREGNANCY  . Anxiety   . Depression   . Gastritis   . GERD (gastroesophageal reflux disease)   . IBS (irritable bowel syndrome)   . Murmur   . SVT (supraventricular tachycardia) (HCC)   . Tendonitis    ARM RIGHT    Family History  Problem Relation Age of Onset  . Stroke Mother   . Stomach cancer Maternal Grandmother   . Leukemia Maternal Grandfather   . Pancreatic cancer Paternal Grandmother   . Healthy Brother   . Sickle cell anemia Half-Sister   . Crohn's disease Half-Sister   . Ulcerative colitis Half-Sister   . Thyroid disease Cousin   . Breast cancer Maternal Aunt 58    Social History   Socioeconomic History  . Marital status: Married    Spouse name: Not on file  . Number of children: Not on file  . Years of education: Not on file  . Highest education level: Not on file  Occupational History  . Not on file  Tobacco Use  . Smoking status: Never Smoker  . Smokeless tobacco: Never Used  Vaping Use  . Vaping Use: Never used  Substance and Sexual Activity  . Alcohol use: Yes    Comment: OCC  . Drug use: Not Currently    Types: Marijuana    Comment: none in years  . Sexual activity: Yes    Birth  control/protection: Surgical, I.U.D.    Comment: Tubal/ IUD  Other Topics Concern  . Not on file  Social History Narrative   ** Merged History Encounter **       Social Determinants of Health   Financial Resource Strain: Not on file  Food Insecurity: Not on file  Transportation Needs: Not on file  Physical Activity: Not on file  Stress: Not on file  Social Connections: Not on file  Intimate Partner Violence: Not on file    Allergies  Allergen Reactions  . Bacitracin Rash     Constitutional: Denies fever, malaise, fatigue, headache or abrupt weight changes.   GU: Pt reports urgency, hesitancy and bladder pressure. Denies frequency, dysuria, burning sensation, blood in urine, odor or discharge. Skin: Denies redness, rashes, lesions or ulcercations.   No other specific complaints in a complete review of systems (except as listed in HPI above).    Objective:   Physical Exam  BP 115/73 (BP Location: Left Arm, Patient Position: Sitting, Cuff Size: Normal)   Pulse 91   Temp 97.7 F (36.5 C) (Temporal)   Resp 17   Ht 5\' 2"  (1.575 m)   Wt 175 lb 12.8 oz (79.7 kg)   SpO2 100%   BMI 32.15  kg/m   Wt Readings from Last 3 Encounters:  11/28/20 178 lb 6.4 oz (80.9 kg)  11/27/20 180 lb 3.2 oz (81.7 kg)  08/17/20 170 lb 12.8 oz (77.5 kg)    General: Appears her stated age, obese, in NAD. Cardiovascular: Normal rate. Pulmonary/Chest: Normal effort. Abdomen: Soft. Normal bowel sounds. No distention or masses noted.  Tender to palpation over the bladder area. No CVA tenderness.        Assessment & Plan:   Urgency, Hesitancy, Bladder Pressure, Flank Pain, Microscopic Hematuria:  Concern for kidney stone, less likely infection Urinalysis: 3+ blood Will send urine culture Continue Doxycycline for mastitis. If there is an underlying UTI, this should treat that also Drink plenty of fluids Discussed obtaining CT renal stone study vs treating for possible kidney stone. She  would like to hold off on CT scan at this time RX for Flomax 0.4 mg PO daily until symptoms resolve RX for Hydrocodone 5-325 mg TID prn #10, 0 refills. PDMP reviewed Advised her if symptoms worsen over the weekend, to go to the ER for CT scan If symptoms persist but no worsen, notify me on Monday and we will obtain CT scan at that time  RTC as needed or if symptoms persist. Nicki Reaper, NP This visit occurred during the SARS-CoV-2 public health emergency.  Safety protocols were in place, including screening questions prior to the visit, additional usage of staff PPE, and extensive cleaning of exam room while observing appropriate contact time as indicated for disinfecting solutions.

## 2020-12-08 LAB — URINE CULTURE
MICRO NUMBER:: 11831706
Result:: NO GROWTH
SPECIMEN QUALITY:: ADEQUATE

## 2020-12-12 ENCOUNTER — Ambulatory Visit (INDEPENDENT_AMBULATORY_CARE_PROVIDER_SITE_OTHER): Payer: BC Managed Care – PPO | Admitting: Surgery

## 2020-12-12 ENCOUNTER — Other Ambulatory Visit: Payer: Self-pay

## 2020-12-12 ENCOUNTER — Telehealth: Payer: Self-pay | Admitting: Surgery

## 2020-12-12 ENCOUNTER — Encounter: Payer: Self-pay | Admitting: Surgery

## 2020-12-12 VITALS — BP 130/81 | HR 90 | Temp 98.3°F | Ht 62.0 in | Wt 178.2 lb

## 2020-12-12 DIAGNOSIS — N611 Abscess of the breast and nipple: Secondary | ICD-10-CM | POA: Diagnosis not present

## 2020-12-12 NOTE — H&P (View-Only) (Signed)
12/12/2020  History of Present Illness: Tammy Bennett is a 40 y.o. female presenting for follow up of left breast abscess.  The patient was last seen on 11/28/20, at which time the left breast area was starting to feel better after spontaneous drainage and oral antibiotic.  Currently denies any pain, redness, induration, or drainage.  She completed her antibiotic course as indicated.  Denies any fevers, chills, chest pain, shortness of breath, nausea, vomiting.  Past Medical History: Past Medical History:  Diagnosis Date  . Allergy   . Anemia    DURING PREGNANCY  . Anxiety   . Depression   . Gastritis   . GERD (gastroesophageal reflux disease)   . IBS (irritable bowel syndrome)   . Murmur   . SVT (supraventricular tachycardia) (HCC)   . Tendonitis    ARM RIGHT     Past Surgical History: Past Surgical History:  Procedure Laterality Date  . catheter abaltion to heart     Treated SVT as teenager  . CHOLECYSTECTOMY    . COLONOSCOPY WITH PROPOFOL N/A 05/30/2019   Procedure: COLONOSCOPY WITH PROPOFOL;  Surgeon: [name], MD;  Location: [facility] ENDOSCOPY;  Service: Gastroenterology;  Laterality: N/A;  . EVACUATION BREAST HEMATOMA Right 01/12/2019   Procedure: RIGHT EXCISION NIPPLE ABSCESS;  Surgeon: Earline Mayotte, MD;  Location: [facility] ORS;  Service: General;  Laterality: Right;  . TONSILLECTOMY    . TUBAL LIGATION      Home Medications: Prior to Admission medications   Medication Sig Start Date End Date Taking? Authorizing Provider  cetirizine (ZYRTEC) 10 MG tablet Take 1 tablet (10 mg total) by mouth daily. Patient taking differently: Take 10 mg by mouth every morning. 10/13/18  Yes Galen Manila, NP  fluticasone (FLONASE) 50 MCG/ACT nasal spray Place 2 sprays into both nostrils daily. 10/13/18  Yes Galen Manila, NP  hydrOXYzine (ATARAX/VISTARIL) 25 MG tablet Take 1 tablet (25 mg total) by mouth 2 (two) times daily as needed for anxiety. May take 1 additional pill  per dose if acute panic attack. Max 3 pills in 24 hours 08/17/20  Yes Malfi, Jodelle Gross, FNP  levonorgestrel (MIRENA) 20 MCG/24HR IUD 1 each by Intrauterine route once.   Yes [provider]  mirtazapine (REMERON) 7.5 MG tablet TAKE 1 TABLET(7.5 MG) BY MOUTH AT BEDTIME 08/17/20  Yes Malfi, Jodelle Gross, FNP  tamsulosin (FLOMAX) 0.4 MG CAPS capsule Take 1 capsule (0.4 mg total) by mouth daily. 12/07/20  Yes Lorre Munroe, NP    Allergies: Allergies  Allergen Reactions  . Bacitracin Rash    Review of Systems: Review of Systems  Constitutional: Negative for chills and fever.  Respiratory: Negative for shortness of breath.   Cardiovascular: Negative for chest pain.  Gastrointestinal: Negative for abdominal pain, nausea and vomiting.  Skin:       No left breast tenderness, redness or drainage.    Physical Exam BP 130/81   Pulse 90   Temp 98.3 F (36.8 C) (Oral)   Ht 5\' 2"  (1.575 m)   Wt 178 lb 3.2 oz (80.8 kg)   LMP 12/03/2020   SpO2 98%   BMI 32.59 kg/m  CONSTITUTIONAL: No acute distress. HEENT:  Normocephalic, atraumatic, extraocular motion intact. RESPIRATORY:  Lungs are clear, and breath sounds are equal bilaterally. Normal respiratory effort without pathologic use of accessory muscles. CARDIOVASCULAR: Heart is regular without murmurs, gallops, or rubs. BREAST:  Left breast has a small area of firmness at the 9 o clock area of the  nipple.  No drainage, erythema, or induration.   NEUROLOGIC:  Motor and sensation is grossly normal.  Cranial nerves are grossly intact. PSYCH:  Alert and oriented to person, place and time. Affect is normal.   Assessment and Plan: This is a 39 y.o. female with healed left breast nipple abscess.  --The area has now healed and there's no evidence of further infection.  Discussed with the patient that we can proceed with excision of the area, similarly to what was done on her right breast two years ago.  She's in agreement and does not want this  episode to happen again.  Discussed with her that we would schedule her for excision of left nipple abscess on 12/20/20.  Reviewed the surgery at length with her, including risks of bleeding, infection, injury to surrounding structures.  She's willing to proceed.  No COVID-19 testing required.  Face-to-face time spent with the patient and care providers was 25 minutes, with more than 50% of the time spent counseling, educating, and coordinating care of the patient.     Renai Lopata Luis Brynlie Daza, MD [facility]    

## 2020-12-12 NOTE — Patient Instructions (Signed)
Our surgery scheduler Britta Mccreedy will call you within 24-48 hours to get you scheduled. If you have not heard from her after 48 hours, please call our office. You will not need to get Covid tested before surgery and have the blue sheet available when she calls to write down important information.   If you have any concerns or questions, please feel free to call our office.     Excision of Skin Lesions, Care After This sheet gives you information about how to care for yourself after your procedure. Your health care provider may also give you more specific instructions. If you have problems or questions, contact your health care provider. What can I expect after the procedure? After your procedure, it is common to have pain or discomfort at the excision site. Follow these instructions at home: Excision care  Follow instructions from your health care provider about how to take care of your excision site. Make sure you: ? Wash your hands with soap and water before and after you change your bandage (dressing). If soap and water are not available, use hand sanitizer. ? Change your dressing as told by your health care provider. ? Leave stitches (sutures), skin glue, or adhesive strips in place. These skin closures may need to stay in place for 2 weeks or longer. If adhesive strip edges start to loosen and curl up, you may trim the loose edges. Do not remove adhesive strips completely unless your health care provider tells you to do that.  Check the excision area every day for signs of infection. Watch for: ? Redness, swelling, or pain. ? Fluid or blood. ? Warmth. ? Pus or a bad smell.  Keep the site clean, dry, and protected for at least 48 hours.  For bleeding, apply gentle but firm pressure to the area using a folded towel for 20 minutes.  Avoid high-impact exercise and activities until the sutures are removed or the area heals.   General instructions  Take over-the-counter and prescription  medicines only as told by your health care provider.  Follow instructions from your health care provider about how to minimize scarring. Scarring should lessen over time.  Avoid sun exposure until the area has healed. Use sunscreen to protect the area from the sun after it has healed.  Keep all follow-up visits as told by your health care provider. This is important. Contact a health care provider if:  You have redness, swelling, or pain around your excision site.  You have fluid or blood coming from your excision site.  Your excision site feels warm to the touch.  You have pus or a bad smell coming from your excision site.  You have a fever.  You have pain that does not improve in 2-3 days after your procedure.  You notice skin irregularities or changes in how you feel (sensation). Summary  This sheet of instructions provides you with information about caring for yourself after your procedure. Contact your health care provider if you have any problems or questions.  Take over-the-counter and prescription medicines only as told by your health care provider.  Change your dressing as told by your health care provider.  Contact a health care provider if you have redness, swelling, pain, or other signs of infection around your excision site.  Keep all follow-up visits as told by your health care provider. This is important. This information is not intended to replace advice given to you by your health care provider. Make sure you discuss any questions  you have with your health care provider. Document Revised: 02/03/2018 Document Reviewed: 02/03/2018 Elsevier Patient Education  2021 [employer].

## 2020-12-12 NOTE — Progress Notes (Signed)
12/12/2020  History of Present Illness: Tammy Bennett is a 40 y.o. female presenting for follow up of left breast abscess.  The patient was last seen on 11/28/20, at which time the left breast area was starting to feel better after spontaneous drainage and oral antibiotic.  Currently denies any pain, redness, induration, or drainage.  She completed her antibiotic course as indicated.  Denies any fevers, chills, chest pain, shortness of breath, nausea, vomiting.  Past Medical History: Past Medical History:  Diagnosis Date  . Allergy   . Anemia    DURING PREGNANCY  . Anxiety   . Depression   . Gastritis   . GERD (gastroesophageal reflux disease)   . IBS (irritable bowel syndrome)   . Murmur   . SVT (supraventricular tachycardia) (HCC)   . Tendonitis    ARM RIGHT     Past Surgical History: Past Surgical History:  Procedure Laterality Date  . catheter abaltion to heart     Treated SVT as teenager  . CHOLECYSTECTOMY    . COLONOSCOPY WITH PROPOFOL N/A 05/30/2019   Procedure: COLONOSCOPY WITH PROPOFOL;  Surgeon: [name], MD;  Location: [facility] ENDOSCOPY;  Service: Gastroenterology;  Laterality: N/A;  . EVACUATION BREAST HEMATOMA Right 01/12/2019   Procedure: RIGHT EXCISION NIPPLE ABSCESS;  Surgeon: Earline Mayotte, MD;  Location: [facility] ORS;  Service: General;  Laterality: Right;  . TONSILLECTOMY    . TUBAL LIGATION      Home Medications: Prior to Admission medications   Medication Sig Start Date End Date Taking? Authorizing Provider  cetirizine (ZYRTEC) 10 MG tablet Take 1 tablet (10 mg total) by mouth daily. Patient taking differently: Take 10 mg by mouth every morning. 10/13/18  Yes Galen Manila, NP  fluticasone (FLONASE) 50 MCG/ACT nasal spray Place 2 sprays into both nostrils daily. 10/13/18  Yes Galen Manila, NP  hydrOXYzine (ATARAX/VISTARIL) 25 MG tablet Take 1 tablet (25 mg total) by mouth 2 (two) times daily as needed for anxiety. May take 1 additional pill  per dose if acute panic attack. Max 3 pills in 24 hours 08/17/20  Yes Malfi, Jodelle Gross, FNP  levonorgestrel (MIRENA) 20 MCG/24HR IUD 1 each by Intrauterine route once.   Yes [provider]  mirtazapine (REMERON) 7.5 MG tablet TAKE 1 TABLET(7.5 MG) BY MOUTH AT BEDTIME 08/17/20  Yes Malfi, Jodelle Gross, FNP  tamsulosin (FLOMAX) 0.4 MG CAPS capsule Take 1 capsule (0.4 mg total) by mouth daily. 12/07/20  Yes Lorre Munroe, NP    Allergies: Allergies  Allergen Reactions  . Bacitracin Rash    Review of Systems: Review of Systems  Constitutional: Negative for chills and fever.  Respiratory: Negative for shortness of breath.   Cardiovascular: Negative for chest pain.  Gastrointestinal: Negative for abdominal pain, nausea and vomiting.  Skin:       No left breast tenderness, redness or drainage.    Physical Exam BP 130/81   Pulse 90   Temp 98.3 F (36.8 C) (Oral)   Ht 5\' 2"  (1.575 m)   Wt 178 lb 3.2 oz (80.8 kg)   LMP 12/03/2020   SpO2 98%   BMI 32.59 kg/m  CONSTITUTIONAL: No acute distress. HEENT:  Normocephalic, atraumatic, extraocular motion intact. RESPIRATORY:  Lungs are clear, and breath sounds are equal bilaterally. Normal respiratory effort without pathologic use of accessory muscles. CARDIOVASCULAR: Heart is regular without murmurs, gallops, or rubs. BREAST:  Left breast has a small area of firmness at the 9 o clock area of the  nipple.  No drainage, erythema, or induration.   NEUROLOGIC:  Motor and sensation is grossly normal.  Cranial nerves are grossly intact. PSYCH:  Alert and oriented to person, place and time. Affect is normal.   Assessment and Plan: This is a 40 y.o. female with healed left breast nipple abscess.  --The area has now healed and there's no evidence of further infection.  Discussed with the patient that we can proceed with excision of the area, similarly to what was done on her right breast two years ago.  She's in agreement and does not want this  episode to happen again.  Discussed with her that we would schedule her for excision of left nipple abscess on 12/20/20.  Reviewed the surgery at length with her, including risks of bleeding, infection, injury to surrounding structures.  She's willing to proceed.  No COVID-19 testing required.  Face-to-face time spent with the patient and care providers was 25 minutes, with more than 50% of the time spent counseling, educating, and coordinating care of the patient.     Howie Ill, MD  [facility]

## 2020-12-12 NOTE — Telephone Encounter (Signed)
Patient has been advised of Pre-Admission date/time, COVID Testing date and Surgery date.  Surgery Date: 12/20/20 Preadmission Testing Date: 12/14/20 (phone 1p-5p) Covid Testing Date: Not needed.   Patient has been made aware to call [phone], between 1-3:00pm the day before surgery, to find out what time to arrive for surgery.

## 2020-12-14 ENCOUNTER — Other Ambulatory Visit
Admission: RE | Admit: 2020-12-14 | Discharge: 2020-12-14 | Disposition: A | Discharge status: Home / Self Care | Payer: BC Managed Care – PPO | Source: Ambulatory Visit | Attending: Surgery | Admitting: Surgery

## 2020-12-14 ENCOUNTER — Other Ambulatory Visit: Payer: Self-pay

## 2020-12-14 NOTE — Patient Instructions (Addendum)
Your procedure is scheduled on: Thursday Dec 20, 2020. Report to Day Surgery inside [facility] (stop by admissions desk first before getting on elevator). To find out your arrival time please call [phone] between 1PM - 3PM on Wednesday Dec 19, 2020.  Remember: Instructions that are not followed completely may result in serious medical risk,  up to and including death, or upon the discretion of your surgeon and anesthesiologist your  surgery may need to be rescheduled.     _X__ 1. Do not eat food after midnight the night before your procedure.                 No chewing gum or hard candies. You may drink clear liquids up to 2 hours                 before you are scheduled to arrive for your surgery- DO not drink clear                 liquids within 2 hours of the start of your surgery.                 Clear Liquids include:  water, apple juice without pulp, clear Gatorade, G2 or                  Gatorade Zero (avoid Red/Purple/Blue), Black Coffee or Tea (Do not add                 anything to coffee or tea).  __X__2.  On the morning of surgery brush your teeth with toothpaste and water, you                may rinse your mouth with mouthwash if you wish.  Do not swallow any toothpaste of mouthwash.     _X__ 3.  No Alcohol for 24 hours before or after surgery.   _X__ 4.  Do Not Smoke or use e-cigarettes For 24 Hours Prior to Your Surgery.                 Do not use any chewable tobacco products for at least 6 hours prior to                 Surgery.  _X__  5.  Do not use any recreational drugs (marijuana, cocaine, heroin, ecstasy, MDMA or other)                For at least one week prior to your surgery.  Combination of these drugs with anesthesia                May have life threatening results.  __X__6.  Notify your doctor if there is any change in your medical condition      (cold, fever, infections).     Do not wear jewelry, make-up, hairpins, clips  or nail polish. Do not wear lotions, powders, or perfumes. You may wear deodorant. Do not shave 48 hours prior to surgery.  Do not bring valuables to the hospital.    [facility] is not responsible for any belongings or valuables.  Contacts, dentures or bridgework may not be worn into surgery. Leave your suitcase in the car. After surgery it may be brought to your room. For patients admitted to the hospital, discharge time is determined by your treatment team.   Patients discharged the day of surgery will not be allowed to drive home.   Make arrangements for someone  to be with you for the first 24 hours of your Same Day Discharge.  __X__ Take these medicines the morning of surgery with A SIP OF WATER:    1. None   2.   3.   4.  5.  6.  ____ Fleet Enema (as directed)   __X__ Use Antibacterial Soap (or wipes) as directed  ____ Use Benzoyl Peroxide Gel as instructed  ____ Use inhalers on the day of surgery  ____ Stop metformin 2 days prior to surgery    ____ Take 1/2 of usual insulin dose the night before surgery. No insulin the morning          of surgery.   ____ Call your PCP, cardiologist, or Pulmonologist if taking Coumadin/Plavix/aspirin and ask when to stop before your surgery.   __X__ One Week prior to surgery- Stop Anti-inflammatories such as Ibuprofen, Aleve, Advil, Motrin, meloxicam (MOBIC), diclofenac, etodolac, ketorolac, Toradol, Daypro, piroxicam, Goody's or BC powders. OK TO USE TYLENOL IF NEEDED   __X__ Stop supplements until after surgery.    ____ Bring C-Pap to the hospital.    If you have any questions regarding your pre-procedure instructions,  Please call Pre-admit Testing at [phone].

## 2020-12-19 MED ORDER — CEFAZOLIN SODIUM-DEXTROSE 2-4 GM/100ML-% IV SOLN
2.0000 g | INTRAVENOUS | Status: AC
  Administered 2020-12-20: 2 g via INTRAVENOUS

## 2020-12-19 MED ORDER — ACETAMINOPHEN 500 MG PO TABS
1000.0000 mg | ORAL_TABLET | ORAL | Status: AC
Start: 2020-12-20 — End: 2020-12-20

## 2020-12-19 MED ORDER — LACTATED RINGERS IV SOLN: INTRAVENOUS | Status: DC

## 2020-12-19 MED ORDER — CHLORHEXIDINE GLUCONATE CLOTH 2 % EX PADS: 6.0000 | MEDICATED_PAD | Freq: Once | CUTANEOUS | Status: DC

## 2020-12-19 MED ORDER — CHLORHEXIDINE GLUCONATE CLOTH 2 % EX PADS
6.0000 | MEDICATED_PAD | Freq: Once | CUTANEOUS | Status: AC
Start: 2020-12-19 — End: 2020-12-20
  Administered 2020-12-20: 6 via TOPICAL

## 2020-12-19 MED ORDER — FAMOTIDINE 20 MG PO TABS: 20.0000 mg | ORAL_TABLET | Freq: Once | ORAL | Status: AC

## 2020-12-19 MED ORDER — GABAPENTIN 300 MG PO CAPS: 300.0000 mg | ORAL_CAPSULE | ORAL | Status: AC

## 2020-12-19 MED ORDER — CHLORHEXIDINE GLUCONATE 0.12 % MT SOLN: 15.0000 mL | Freq: Once | OROMUCOSAL | Status: AC

## 2020-12-19 MED ORDER — ORAL CARE MOUTH RINSE: 15.0000 mL | Freq: Once | OROMUCOSAL | Status: AC

## 2020-12-20 ENCOUNTER — Ambulatory Visit
Admission: RE | Admit: 2020-12-20 | Discharge: 2020-12-20 | Disposition: A | Discharge status: Home / Self Care | Payer: BC Managed Care – PPO | Source: Ambulatory Visit | Attending: Surgery | Admitting: Surgery

## 2020-12-20 ENCOUNTER — Encounter
Admission: RE | Disposition: A | Discharge status: Home / Self Care | Payer: Self-pay | Source: Ambulatory Visit | Attending: Surgery

## 2020-12-20 ENCOUNTER — Encounter: Payer: Self-pay | Admitting: Surgery

## 2020-12-20 ENCOUNTER — Ambulatory Visit: Payer: BC Managed Care – PPO | Admitting: Certified Registered"

## 2020-12-20 ENCOUNTER — Other Ambulatory Visit: Payer: Self-pay

## 2020-12-20 DIAGNOSIS — N611 Abscess of the breast and nipple: Secondary | ICD-10-CM | POA: Diagnosis not present

## 2020-12-20 DIAGNOSIS — Z79899 Other long term (current) drug therapy: Secondary | ICD-10-CM | POA: Diagnosis not present

## 2020-12-20 DIAGNOSIS — Z881 Allergy status to other antibiotic agents status: Secondary | ICD-10-CM | POA: Insufficient documentation

## 2020-12-20 DIAGNOSIS — G5601 Carpal tunnel syndrome, right upper limb: Secondary | ICD-10-CM | POA: Diagnosis not present

## 2020-12-20 DIAGNOSIS — Z975 Presence of (intrauterine) contraceptive device: Secondary | ICD-10-CM | POA: Diagnosis not present

## 2020-12-20 DIAGNOSIS — N6022 Fibroadenosis of left breast: Secondary | ICD-10-CM

## 2020-12-20 HISTORY — PX: MASS EXCISION: SHX2000

## 2020-12-20 LAB — URINE DRUG SCREEN, QUALITATIVE (ARMC ONLY)
Amphetamines, Ur Screen: NOT DETECTED
Barbiturates, Ur Screen: NOT DETECTED
Benzodiazepine, Ur Scrn: NOT DETECTED
Cannabinoid 50 Ng, Ur ~~LOC~~: POSITIVE — AB
Cocaine Metabolite,Ur ~~LOC~~: NOT DETECTED
MDMA (Ecstasy)Ur Screen: NOT DETECTED
Methadone Scn, Ur: NOT DETECTED
Opiate, Ur Screen: NOT DETECTED
Phencyclidine (PCP) Ur S: NOT DETECTED
Tricyclic, Ur Screen: NOT DETECTED

## 2020-12-20 LAB — POCT PREGNANCY, URINE: Preg Test, Ur: NEGATIVE

## 2020-12-20 SURGERY — EXCISION MASS
Anesthesia: General | Laterality: Left

## 2020-12-20 MED ORDER — FAMOTIDINE 20 MG PO TABS
ORAL_TABLET | ORAL | Status: AC
  Administered 2020-12-20: 20 mg via ORAL
  Filled 2020-12-20: qty 1

## 2020-12-20 MED ORDER — IBUPROFEN 800 MG PO TABS: 800.0000 mg | ORAL_TABLET | Freq: Three times a day (TID) | ORAL | 0 refills | Status: DC | PRN

## 2020-12-20 MED ORDER — ACETAMINOPHEN 500 MG PO TABS
ORAL_TABLET | ORAL | Status: AC
  Administered 2020-12-20: 1000 mg via ORAL
  Filled 2020-12-20: qty 2

## 2020-12-20 MED ORDER — ONDANSETRON HCL 4 MG/2ML IJ SOLN: 4.0000 mg | Freq: Once | INTRAMUSCULAR | Status: DC | PRN

## 2020-12-20 MED ORDER — OXYCODONE HCL 5 MG PO TABS: 5.0000 mg | ORAL_TABLET | ORAL | 0 refills | Status: DC | PRN

## 2020-12-20 MED ORDER — CHLORHEXIDINE GLUCONATE 0.12 % MT SOLN
OROMUCOSAL | Status: AC
  Administered 2020-12-20: 15 mL via OROMUCOSAL
  Filled 2020-12-20: qty 15

## 2020-12-20 MED ORDER — PROPOFOL 10 MG/ML IV BOLUS
INTRAVENOUS | Status: AC
  Filled 2020-12-20: qty 20

## 2020-12-20 MED ORDER — FENTANYL CITRATE (PF) 100 MCG/2ML IJ SOLN
INTRAMUSCULAR | Status: AC
  Filled 2020-12-20: qty 2

## 2020-12-20 MED ORDER — FENTANYL CITRATE (PF) 100 MCG/2ML IJ SOLN
INTRAMUSCULAR | Status: DC | PRN
  Administered 2020-12-20 (×2): 50 ug via INTRAVENOUS

## 2020-12-20 MED ORDER — MIDAZOLAM HCL 2 MG/2ML IJ SOLN
INTRAMUSCULAR | Status: AC
  Filled 2020-12-20: qty 2

## 2020-12-20 MED ORDER — BUPIVACAINE HCL (PF) 0.5 % IJ SOLN
INTRAMUSCULAR | Status: AC
  Filled 2020-12-20: qty 30

## 2020-12-20 MED ORDER — BUPIVACAINE HCL (PF) 0.5 % IJ SOLN
INTRAMUSCULAR | Status: DC | PRN
  Administered 2020-12-20: 2 mL
  Administered 2020-12-20: 28 mL

## 2020-12-20 MED ORDER — FENTANYL CITRATE (PF) 250 MCG/5ML IJ SOLN
INTRAMUSCULAR | Status: AC
  Filled 2020-12-20: qty 5

## 2020-12-20 MED ORDER — MEPERIDINE HCL 50 MG/ML IJ SOLN
INTRAMUSCULAR | Status: AC
  Administered 2020-12-20: 12.5 mg via INTRAVENOUS
  Filled 2020-12-20: qty 1

## 2020-12-20 MED ORDER — LIDOCAINE HCL (CARDIAC) PF 100 MG/5ML IV SOSY
PREFILLED_SYRINGE | INTRAVENOUS | Status: DC | PRN
  Administered 2020-12-20: 100 mg via INTRAVENOUS

## 2020-12-20 MED ORDER — MIDAZOLAM HCL 2 MG/2ML IJ SOLN
INTRAMUSCULAR | Status: DC | PRN
  Administered 2020-12-20: 2 mg via INTRAVENOUS

## 2020-12-20 MED ORDER — MEPERIDINE HCL 50 MG/ML IJ SOLN: 6.2500 mg | INTRAMUSCULAR | Status: DC | PRN

## 2020-12-20 MED ORDER — PROPOFOL 10 MG/ML IV BOLUS
INTRAVENOUS | Status: DC | PRN
  Administered 2020-12-20: 160 mg via INTRAVENOUS
  Administered 2020-12-20: 40 mg via INTRAVENOUS

## 2020-12-20 MED ORDER — PHENYLEPHRINE HCL (PRESSORS) 10 MG/ML IV SOLN
INTRAVENOUS | Status: DC | PRN
  Administered 2020-12-20: 100 ug via INTRAVENOUS

## 2020-12-20 MED ORDER — FENTANYL CITRATE (PF) 100 MCG/2ML IJ SOLN
25.0000 ug | INTRAMUSCULAR | Status: DC | PRN
  Administered 2020-12-20: 25 ug via INTRAVENOUS

## 2020-12-20 MED ORDER — CEFAZOLIN SODIUM-DEXTROSE 2-4 GM/100ML-% IV SOLN
INTRAVENOUS | Status: AC
  Filled 2020-12-20: qty 100

## 2020-12-20 MED ORDER — GABAPENTIN 300 MG PO CAPS
ORAL_CAPSULE | ORAL | Status: AC
  Administered 2020-12-20: 300 mg via ORAL
  Filled 2020-12-20: qty 1

## 2020-12-20 SURGICAL SUPPLY — 29 items
ADH SKN CLS APL DERMABOND .7 (GAUZE/BANDAGES/DRESSINGS) ×1
APL PRP STRL LF DISP 70% ISPRP (MISCELLANEOUS) ×1
CHLORAPREP W/TINT 26 (MISCELLANEOUS) ×2 IMPLANT
COVER WAND RF STERILE (DRAPES) ×2 IMPLANT
DERMABOND ADVANCED (GAUZE/BANDAGES/DRESSINGS) ×1
DERMABOND ADVANCED .7 DNX12 (GAUZE/BANDAGES/DRESSINGS) ×1 IMPLANT
DRAPE 3/4 80X56 (DRAPES) ×4 IMPLANT
DRAPE LAPAROTOMY 100X77 ABD (DRAPES) ×2 IMPLANT
ELECT CAUTERY BLADE 6.4 (BLADE) ×2 IMPLANT
ELECT REM PT RETURN 9FT ADLT (ELECTROSURGICAL) ×2
ELECTRODE REM PT RTRN 9FT ADLT (ELECTROSURGICAL) ×1 IMPLANT
GLOVE SURG SYN 7.0 (GLOVE) ×2 IMPLANT
GLOVE SURG SYN 7.5  E (GLOVE) ×1
GLOVE SURG SYN 7.5 E (GLOVE) ×1 IMPLANT
GOWN STRL REUS W/ TWL LRG LVL3 (GOWN DISPOSABLE) ×2 IMPLANT
GOWN STRL REUS W/TWL LRG LVL3 (GOWN DISPOSABLE) ×4
KIT TURNOVER KIT A (KITS) ×2 IMPLANT
LABEL OR SOLS (LABEL) ×2 IMPLANT
MANIFOLD NEPTUNE II (INSTRUMENTS) ×2 IMPLANT
NEEDLE HYPO 22GX1.5 SAFETY (NEEDLE) ×2 IMPLANT
NS IRRIG 1000ML POUR BTL (IV SOLUTION) ×2 IMPLANT
PACK BASIN MINOR ARMC (MISCELLANEOUS) ×2 IMPLANT
SUT MNCRL 4-0 (SUTURE) ×2
SUT MNCRL 4-0 27XMFL (SUTURE) ×1
SUT VIC AB 0 SH 27 (SUTURE) ×4 IMPLANT
SUT VIC AB 3-0 SH 27 (SUTURE) ×4
SUT VIC AB 3-0 SH 27X BRD (SUTURE) ×2 IMPLANT
SUTURE MNCRL 4-0 27XMF (SUTURE) ×1 IMPLANT
SYR 30ML LL (SYRINGE) ×2 IMPLANT

## 2020-12-20 NOTE — Anesthesia Procedure Notes (Signed)
Procedure Name: LMA Insertion Date/Time: 12/20/2020 11:22 AM Performed by: Danelle Berry, CRNA Pre-anesthesia Checklist: Patient identified, Emergency Drugs available, Suction available and Patient being monitored Patient Re-evaluated:Patient Re-evaluated prior to induction Oxygen Delivery Method: Circle system utilized Preoxygenation: Pre-oxygenation with 100% oxygen Induction Type: IV induction Ventilation: Mask ventilation without difficulty LMA: LMA inserted LMA Size: 3.5 Number of attempts: 1 Placement Confirmation: positive ETCO2 and breath sounds checked- equal and bilateral Tube secured with: Tape Dental Injury: Teeth and Oropharynx as per pre-operative assessment

## 2020-12-20 NOTE — Transfer of Care (Signed)
Immediate Anesthesia Transfer of Care Note  Patient: Tammy Bennett  Procedure(s) Performed: EXCISION left nipple abscess (Left )  Patient Location: PACU  Anesthesia Type:General  Level of Consciousness: awake, alert  and oriented  Airway & Oxygen Therapy: Patient Spontanous Breathing and Patient connected to face mask oxygen  Post-op Assessment: Report given to RN and Post -op Vital signs reviewed and stable  Post vital signs: Reviewed and stable  Last Vitals:  Vitals Value Taken Time  BP 137/115 12/20/20 1215  Temp    Pulse 96 12/20/20 1216  Resp 19 12/20/20 1216  SpO2 100 % 12/20/20 1216  Vitals shown include unvalidated device data.  Last Pain:  Vitals:   12/20/20 1055  TempSrc: Temporal  PainSc: 0-No pain      Patients Stated Pain Goal: 0 (12/20/20 1055)  Complications: No complications documented.

## 2020-12-20 NOTE — Anesthesia Postprocedure Evaluation (Signed)
Anesthesia Post Note  Patient: Tammy Bennett  Procedure(s) Performed: EXCISION left nipple abscess (Left )  Patient location during evaluation: PACU Anesthesia Type: General Level of consciousness: awake and alert, awake and oriented Pain management: pain level controlled Vital Signs Assessment: post-procedure vital signs reviewed and stable Respiratory status: spontaneous breathing, nonlabored ventilation and respiratory function stable Cardiovascular status: blood pressure returned to baseline and stable Postop Assessment: no apparent nausea or vomiting Anesthetic complications: no   No complications documented.   Last Vitals:  Vitals:   12/20/20 1245 12/20/20 1304  BP: 124/85 (!) 129/91  Pulse: 85 81  Resp: 12 16  Temp: 37.6 C 36.6 C  SpO2: 100% 100%    Last Pain:  Vitals:   12/20/20 1304  TempSrc: Temporal  PainSc: 0-No pain                 Manfred Arch

## 2020-12-20 NOTE — Discharge Instructions (Signed)

## 2020-12-20 NOTE — Op Note (Signed)
  Procedure Date:  12/20/2020  Pre-operative Diagnosis:  History of left breast nipple abscess  Post-operative Diagnosis:  History of left breast nipple abscess  Procedure:  Excision of left breast nipple abscess  Surgeon:  Howie Ill, MD  Anesthesia:  General endotracheal  Estimated Blood Loss:  5 ml  Specimens:  Left breast abscess tissue  Complications:  None  Indications for Procedure:  This is a 40 y.o. female with diagnosis of prior left breast abscess which improved on antibiotics alone.  She presents now for excision of the area to prevent recurrence.  The risks of bleeding, abscess or infection, injury to surrounding structures, and need for further procedures were all discussed with the patient and was willing to proceed.  Description of Procedure: The patient was correctly identified in the preoperative area and brought into the operating room.  The patient was placed supine with VTE prophylaxis in place.  Appropriate time-outs were performed.  Anesthesia was induced and the patient was intubated.  Appropriate antibiotics were infused.  The patient's left breast was prepped and draped in usual sterile fashion.  The patient's abscess had been located in the 9 o clock position, going from the areola border to the shaft of the nipple itself.  Local anesthetic was infused intradermally.  A 3 cm incision was made over the abscess location.  Cautery was used to dissect around the subcutaneous tissue and creating flaps, and to excise the remnant scar tissue from her abscess.  There was no purulent fluid.  After excision, the area was irrigated and hemostasis achieved with cautery.  Local anesthetic was further infiltrated.  The wound was then closed in layers using 3-0 Vicryl and 4-0 Monocryl.  The wound was cleaned and dressed with DermaBond.  The patient was then emerged from anesthesia, extubated, and brought to the recovery room for further management.  The patient tolerated  the procedure well and all counts were correct at the end of the case.   Howie Ill, MD

## 2020-12-20 NOTE — Interval H&P Note (Signed)
History and Physical Interval Note:  12/20/2020 10:41 AM  Tammy Bennett Her  has presented today for surgery, with the diagnosis of left nipple abscess.  The various methods of treatment have been discussed with the patient and family. After consideration of risks, benefits and other options for treatment, the patient has consented to  Procedure(s): EXCISION left nipple abscess (Left) as a surgical intervention.  The patient's history has been reviewed, patient examined, no change in status, stable for surgery.  I have reviewed the patient's chart and labs.  Questions were answered to the patient's satisfaction.     [name]

## 2020-12-20 NOTE — Anesthesia Preprocedure Evaluation (Signed)
Anesthesia Evaluation  Patient identified by MRN, date of birth, ID band Patient awake    Reviewed: Allergy & Precautions, H&P , NPO status , Patient's Chart, lab work & pertinent test results, reviewed documented beta blocker date and time   Airway Mallampati: II   Neck ROM: full    Dental  (+) Poor Dentition   Pulmonary neg pulmonary ROS, former smoker,    Pulmonary exam normal        Cardiovascular Exercise Tolerance: Good Normal cardiovascular exam+ dysrhythmias Supra Ventricular Tachycardia + Valvular Problems/Murmurs  Rhythm:regular Rate:Normal     Neuro/Psych PSYCHIATRIC DISORDERS Anxiety Depression  Neuromuscular disease negative psych ROS   GI/Hepatic negative GI ROS, Neg liver ROS, GERD  Medicated,  Endo/Other  negative endocrine ROS  Renal/GU negative Renal ROS  negative genitourinary   Musculoskeletal   Abdominal   Peds  Hematology negative hematology ROS (+) Blood dyscrasia, anemia ,   Anesthesia Other Findings . Allergy  . Anemia   DURING PREGNANCY . Anxiety  . Depression  . Gastritis  . GERD (gastroesophageal reflux disease)  . IBS (irritable bowel syndrome)  . Murmur  . SVT (supraventricular tachycardia) (HCC) Ablation as teenager, resolved . Tendonitis   ARM RIGHT    Reproductive/Obstetrics negative OB ROS                            Anesthesia Physical  Anesthesia Plan  ASA: III  Anesthesia Plan: General   Post-op Pain Management:    Induction: Intravenous  PONV Risk Score and Plan: 2 and Midazolam and Ondansetron  Airway Management Planned: LMA  Additional Equipment:   Intra-op Plan:   Post-operative Plan: Extubation in OR  Informed Consent: I have reviewed the patients History and Physical, chart, labs and discussed the procedure including the risks, benefits and alternatives for the proposed anesthesia with the patient or authorized  representative who has indicated his/her understanding and acceptance.     Dental Advisory Given  Plan Discussed with: CRNA, Anesthesiologist and Surgeon  Anesthesia Plan Comments:        Anesthesia Quick Evaluation

## 2020-12-21 ENCOUNTER — Encounter: Payer: Self-pay | Admitting: Surgery

## 2020-12-21 LAB — SURGICAL PATHOLOGY

## 2020-12-25 ENCOUNTER — Encounter: Payer: Self-pay | Admitting: Surgery

## 2020-12-27 ENCOUNTER — Ambulatory Visit (INDEPENDENT_AMBULATORY_CARE_PROVIDER_SITE_OTHER): Payer: BC Managed Care – PPO | Admitting: Physician Assistant

## 2020-12-27 ENCOUNTER — Encounter: Payer: Self-pay | Admitting: Physician Assistant

## 2020-12-27 ENCOUNTER — Other Ambulatory Visit: Payer: Self-pay

## 2020-12-27 VITALS — BP 117/84 | HR 98 | Temp 98.5°F | Ht 62.0 in | Wt 171.0 lb

## 2020-12-27 DIAGNOSIS — Z09 Encounter for follow-up examination after completed treatment for conditions other than malignant neoplasm: Secondary | ICD-10-CM

## 2020-12-27 DIAGNOSIS — N611 Abscess of the breast and nipple: Secondary | ICD-10-CM

## 2020-12-27 MED ORDER — AMOXICILLIN-POT CLAVULANATE 875-125 MG PO TABS: 1.0000 | ORAL_TABLET | Freq: Two times a day (BID) | ORAL | 0 refills | Status: AC

## 2020-12-27 NOTE — Patient Instructions (Signed)
Follow up as scheduled next week. Start your antibiotics.

## 2020-12-27 NOTE — Progress Notes (Signed)
[facility] POST-OP OFFICE VISIT  12/27/2020  HPI: Tammy Bennett is a 40 y.o. female 7 days s/p excision of left breast nipple abscess with Dr Aleen Campi.   Patient reports that in the last 24-48 hours she has noticed increasing erythema and drainage at her left breast incision site. This has become tender as well. Drainage described as "thin and reddish." No fevers reported at home. Otherwise doing well  Vital signs: BP 117/84   Pulse 98   Temp 98.5 F (36.9 C)   Ht 5\' 2"  (1.575 m)   Wt 171 lb (77.6 kg)   LMP 12/03/2020 (Exact Date)   SpO2 97%   BMI 31.28 kg/m    Physical Exam: Constitutional: Well appearing female, NAD Skin: Incision site to the 9 o'clock position of the left breast which seems to have dehisced superficially, <5 mm deep. Underlying visible tissue appears healthy. There does appear to be some serous fluid drianing from this. There is erythema extending medially to the mid breast. This is indurated, no areas of fluctuance, no expressible purulence   Assessment/Plan: This is a 40 y.o. female 7 days s/p excision of left breast nipple abscess.   - I do believe she has some degree of cellulitis, no evidence of abscess currently. I will send her with Augmentin BID x7 days. She understands to monitor for fever, worsening erythema, or change in pain/drainage.   - Superficial dressing changes daily  - Reviewed Pathology ;    -- BENIGN MAMMARY PARENCHYMA WITH DENSE   FIBROSIS, AND FIBROCYSTIC AND    APOCRINE CHANGES.    -- MINIMAL RESIDUAL ACUTE INFLAMMATION,    WITHOUT DEFINITE ABSCESS.    -- NEGATIVE FOR ATYPICAL PROLIFERATIVE BREAST   DISEASE.   - Will follow up in ~1 week for recheck   -- 24, PA-C [facility] 12/27/2020, 10:49 AM [phone] M-F: 7am - 4pm

## 2021-01-02 ENCOUNTER — Ambulatory Visit: Payer: Self-pay | Admitting: *Deleted

## 2021-01-02 NOTE — Telephone Encounter (Signed)
  Pt called to schedule CPE and then asked about mentioning other issues during the appt/ advised er she maybe charge for discussing other issues during cpe / se stated she was having on and off shooting pain from ankle to leg as well as headaches and chest pain/ spoke with [facility] and pt hung up / nurses were going in meeting and asked to put in clinical call/ please advise   Called patient to review c/o . Patient reports she is not having significant pains at this time. C/o chest pain in middle of chest at sternum to stomach comes and goes and is not present now. Denies chest pain greater than 5 minutes with pressure like symptoms, no difficulty breathing or fever. Patient reports she thinks it may be acid reflux. Pain noted from sternum to stomach and feels like food or liquids are "stuck" or slow "not  going down". C/o headaches that are noted in different areas of her head at times. Denies visual issues , dizziness or lightheadedness. No balance issues. appt is scheduled for 01/17/21 and patient reports she can review these issues with PCP at that time. If PCP recommends she be seen sooner she will. Care advise given. Patient verbalized understanding of care advise and to call back or go to [facility] or ED if symptoms worsen.  Reason for Disposition . Chest pain(s) lasting a few seconds  Answer Assessment - Initial Assessment Questions 1. LOCATION: "Where does it hurt?"       Middle of chest , sternum area  2. RADIATION: "Does the pain go anywhere else?" (e.g., into neck, jaw, arms, back)     No  3. ONSET: "When did the chest pain begin?" (Minutes, hours or days)     Did not given when began 4. PATTERN "Does the pain come and go, or has it been constant since it started?"  "Does it get worse with exertion?"      Comes and goes  5. DURATION: "How long does it last" (e.g., seconds, minutes, hours)     Na  6. SEVERITY: "How bad is the pain?"  (e.g., Scale 1-10; mild, moderate, or severe)    - MILD (1-3):  doesn't interfere with normal activities     - MODERATE (4-7): interferes with normal activities or awakens from sleep    - SEVERE (8-10): excruciating pain, unable to do any normal activities       Feels like food or liquid will not go down  7. CARDIAC RISK FACTORS: "Do you have any history of heart problems or risk factors for heart disease?" (e.g., angina, prior heart attack; diabetes, high blood pressure, high cholesterol, smoker, or strong family history of heart disease)     na 8. PULMONARY RISK FACTORS: "Do you have any history of lung disease?"  (e.g., blood clots in lung, asthma, emphysema, birth control pills)     na 9. CAUSE: "What do you think is causing the chest pain?"     Acid reflux  10. OTHER SYMPTOMS: "Do you have any other symptoms?" (e.g., dizziness, nausea, vomiting, sweating, fever, difficulty breathing, cough)       Feels like swallowing food or liquids get "stuck" or will not "go down" 11. PREGNANCY: "Is there any chance you are pregnant?" "When was your last menstrual period?"       na  Protocols used: CHEST PAIN-A-AH

## 2021-01-02 NOTE — Telephone Encounter (Signed)
The pt was notified of the recommendation and appt scheduled.

## 2021-01-02 NOTE — Telephone Encounter (Signed)
I will not be able to address all these acute issues at her annual exam. I suggest she make a separate appt.

## 2021-01-04 ENCOUNTER — Ambulatory Visit (INDEPENDENT_AMBULATORY_CARE_PROVIDER_SITE_OTHER): Payer: BC Managed Care – PPO | Admitting: Surgery

## 2021-01-04 ENCOUNTER — Other Ambulatory Visit: Payer: Self-pay

## 2021-01-04 ENCOUNTER — Encounter: Payer: BC Managed Care – PPO | Admitting: Surgery

## 2021-01-04 ENCOUNTER — Encounter: Payer: Self-pay | Admitting: Surgery

## 2021-01-04 VITALS — BP 130/85 | HR 97 | Temp 98.6°F | Ht 62.0 in | Wt 174.0 lb

## 2021-01-04 DIAGNOSIS — N611 Abscess of the breast and nipple: Secondary | ICD-10-CM | POA: Diagnosis not present

## 2021-01-04 DIAGNOSIS — Z09 Encounter for follow-up examination after completed treatment for conditions other than malignant neoplasm: Secondary | ICD-10-CM

## 2021-01-04 MED ORDER — OXYCODONE HCL 5 MG PO TABS: 5.0000 mg | ORAL_TABLET | Freq: Four times a day (QID) | ORAL | 0 refills | Status: DC | PRN

## 2021-01-04 NOTE — Patient Instructions (Signed)
Remove the dressing and wash with soap and water- Repack the wound with the packing material and cover with dry dressing. Please see your follow up appointment listed below.

## 2021-01-04 NOTE — Progress Notes (Signed)
01/04/2021  HPI: Tammy Bennett is a 40 y.o. female s/p excision of left breast abscess on 12/20/20.  She had a post-op wound infection with dehiscence, and was given Augmentin for it.  She presents today for follow up.  She reports that the wound has opened fully now and is having drainage as well as tenderness.  Denies any fevers, chills, chest pain, shortness of breath.  Vital signs: BP 130/85   Pulse 97   Temp 98.6 F (37 C)   Ht 5\' 2"  (1.575 m)   Wt 174 lb (78.9 kg)   LMP 12/21/2020   SpO2 97%   BMI 31.83 kg/m    Physical Exam: Constitutional: No acute distress Breast:  Left breast s/p excision in medial portion next to nipple.  The wound is now open, measuring about 1.5 cm in size.  It has fibrinous material over the skin edges and over the central portion of the wound.  This was excised sharply using scissors down to clean wound edges and base, for area < 20 sqcm.  Following debridement, a healthy wound bed was appreciable, with good granulation tissue and no purulence.  The wound was then packed with 1/4 inch gauze, covered with 4x4 gauze and tape.  Assessment/Plan: This is a 40 y.o. female s/p excision of left breast abscess, with post-op infection and open wound.  --Wound debrided in the office, excising sharply the fibrinous material that had built up.  The underlying wound was healthy without any evidence of infection.  No further antibiotics are needed.  Instructed on dressing changes. --She will follow up with me next week for wound check. --Will give her a new prescription for pain medication -- oxycodone 5 mg tablets.   24, MD [facility]

## 2021-01-10 ENCOUNTER — Ambulatory Visit: Payer: BC Managed Care – PPO | Admitting: Obstetrics and Gynecology

## 2021-01-11 ENCOUNTER — Encounter: Payer: Self-pay | Admitting: Surgery

## 2021-01-11 ENCOUNTER — Ambulatory Visit (INDEPENDENT_AMBULATORY_CARE_PROVIDER_SITE_OTHER): Payer: BC Managed Care – PPO | Admitting: Surgery

## 2021-01-11 ENCOUNTER — Other Ambulatory Visit: Payer: Self-pay

## 2021-01-11 VITALS — BP 110/68 | HR 74 | Temp 98.4°F | Ht 62.0 in | Wt 174.6 lb

## 2021-01-11 DIAGNOSIS — Z09 Encounter for follow-up examination after completed treatment for conditions other than malignant neoplasm: Secondary | ICD-10-CM

## 2021-01-11 DIAGNOSIS — N611 Abscess of the breast and nipple: Secondary | ICD-10-CM

## 2021-01-11 NOTE — Progress Notes (Signed)
01/11/2021  HPI: Tammy Bennett is a 40 y.o. female s/p excision of left breast abscess on 12/20/2020.  This was complicated with dehiscence of the incision.  She was seen on 01/04/2021 at which time she had sharp debridement of some fibrinous material covering the wound.  She is now having packing dressing changes that she continues to do without any complications.  She reports today that she still having discomfort in the left breast area and is very sensitive as well.  Denies any fevers, chills, chest pain, shortness of breath.  Denies any purulent drainage.  Vital signs: BP 110/68   Pulse 74   Temp 98.4 F (36.9 C) (Oral)   Ht 5\' 2"  (1.575 m)   Wt 174 lb 9.6 oz (79.2 kg)   LMP 12/21/2020   SpO2 99%   BMI 31.93 kg/m    Physical Exam: Constitutional: No acute distress Breast: Patient's left breast has an open wound measuring about 1 cm in diameter with beefy red healthy granulation tissue without any purulence or fibrinous material.  Wound was repacked with gauze and covered with dry gauze dressing.  No erythema, induration, or swelling.  There is only some firmness palpable at the wound bed itself consistent with scar tissue but otherwise no fluctuance or evidence of further infection.  Assessment/Plan: This is a 40 y.o. female s/p excision of left breast abscess with wound dehiscence.  - Patient is currently healing well reassured her that the wound bed appears very healthy and that is exactly how it should be looking.  She will continue to see doing daily dressing changes and she will follow-up with me in 2 more weeks for wound check.   24, MD [facility]

## 2021-01-11 NOTE — Patient Instructions (Signed)
Continue to remove the packing and wash the wound with soap and water. Rinse well. Cover with dry gauze and secure with tape.

## 2021-01-16 ENCOUNTER — Ambulatory Visit: Payer: BC Managed Care – PPO | Admitting: Obstetrics and Gynecology

## 2021-01-17 ENCOUNTER — Encounter: Payer: BC Managed Care – PPO | Admitting: Internal Medicine

## 2021-01-24 ENCOUNTER — Ambulatory Visit: Payer: BC Managed Care – PPO | Admitting: Internal Medicine

## 2021-01-24 NOTE — Progress Notes (Deleted)
Subjective:    Patient ID: Tammy Bennett, female    DOB: [birth year], 40 y.o.   MRN: [id number]  HPI  Patient presents the clinic today for follow-up of chronic conditions.  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  She is not currently seeing a therapist.  She denies depression, SI/HI.  Insomnia: She has difficulty.  She is taking Mirtazapine as prescribed.  There is no sleep study on file.  She also reports ankle pain.  Review of Systems     Past Medical History:  Diagnosis Date   Allergy    Anemia    DURING PREGNANCY   Anxiety    Depression    Gastritis    GERD (gastroesophageal reflux disease)    IBS (irritable bowel syndrome)    Murmur    as a child    SVT (supraventricular tachycardia) (HCC)    Tendonitis    ARM RIGHT    Current Outpatient Medications  Medication Sig Dispense Refill   cetirizine (ZYRTEC) 10 MG tablet Take 1 tablet (10 mg total) by mouth daily. (Patient taking differently: Take 10 mg by mouth daily as needed for allergies.)     fluticasone (FLONASE) 50 MCG/ACT nasal spray Place 2 sprays into both nostrils daily. 16 g 6   hydrOXYzine (ATARAX/VISTARIL) 25 MG tablet Take 1 tablet (25 mg total) by mouth 2 (two) times daily as needed for anxiety. May take 1 additional pill per dose if acute panic attack. Max 3 pills in 24 hours (Patient taking differently: Take 25-50 mg by mouth 2 (two) times daily as needed for anxiety. May take 1 additional pill per dose if acute panic attack. Max 3 pills in 24 hours) 30 tablet 2   ibuprofen (ADVIL) 800 MG tablet Take 1 tablet (800 mg total) by mouth every 8 (eight) hours as needed for moderate pain. 60 tablet 0   levonorgestrel (MIRENA) 20 MCG/24HR IUD 1 each by Intrauterine route once.     mirtazapine (REMERON) 7.5 MG tablet TAKE 1 TABLET(7.5 MG) BY MOUTH AT BEDTIME (Patient taking differently: Take 7.5 mg by mouth at bedtime.) 90 tablet 1   oxyCODONE (OXY IR/ROXICODONE) 5 MG immediate  release tablet Take 1 tablet (5 mg total) by mouth every 6 (six) hours as needed for severe pain. 20 tablet 0   tamsulosin (FLOMAX) 0.4 MG CAPS capsule Take 1 capsule (0.4 mg total) by mouth daily. 30 capsule 3   No current facility-administered medications for this visit.    Allergies  Allergen Reactions   Bacitracin Rash    Family History  Problem Relation Age of Onset   Stroke Mother    Stomach cancer Maternal Grandmother    Leukemia Maternal Grandfather    Pancreatic cancer Paternal Grandmother    Healthy Brother    Sickle cell anemia Half-Sister    Crohn's disease Half-Sister    Ulcerative colitis Half-Sister    Thyroid disease Cousin    Breast cancer Maternal Aunt 74    Social History   Socioeconomic History   Marital status: Married    Spouse name: Not on file   Number of children: Not on file   Years of education: Not on file   Highest education level: Not on file  Occupational History   Not on file  Tobacco Use   Smoking status: Never   Smokeless tobacco: Never  Vaping Use   Vaping Use: Never used  Substance and Sexual Activity   Alcohol use: Yes  Comment: OCC   Drug use: Not Currently    Types: Marijuana    Comment: none in years   Sexual activity: Yes    Birth control/protection: Surgical, I.U.D.    Comment: Tubal/ IUD  Other Topics Concern   Not on file  Social History Narrative   ** Merged History Encounter **       Social Determinants of Health   Financial Resource Strain: Not on file  Food Insecurity: Not on file  Transportation Needs: Not on file  Physical Activity: Not on file  Stress: Not on file  Social Connections: Not on file  Intimate Partner Violence: Not on file     Constitutional: Denies fever, malaise, fatigue, headache or abrupt weight changes.  HEENT: Denies eye pain, eye redness, ear pain, ringing in the ears, wax buildup, runny nose, nasal congestion, bloody nose, or sore throat. Respiratory: Denies difficulty  breathing, shortness of breath, cough or sputum production.   Cardiovascular: Denies chest pain, chest tightness, palpitations or swelling in the hands or feet.  Gastrointestinal: Patient reports intermittent reflux.  Denies abdominal pain, bloating, constipation, diarrhea or blood in the stool.  GU: Denies urgency, frequency, pain with urination, burning sensation, blood in urine, odor or discharge. Musculoskeletal: Patient reports ankle pain.  Denies decrease in range of motion, difficulty with gait, muscle pain or joint swelling.  Skin: Denies redness, rashes, lesions or ulcercations.  Neurological: Patient reports insomnia.  Denies dizziness, difficulty with memory, difficulty with speech or problems with balance and coordination.  Psych: Patient has a history of anxiety and panic attacks.  Denies depression, SI/HI.  No other specific complaints in a complete review of systems (except as listed in HPI above).  Objective:   Physical Exam   There were no vitals taken for this visit. Wt Readings from Last 3 Encounters:  01/11/21 174 lb 9.6 oz (79.2 kg)  01/04/21 174 lb (78.9 kg)  12/27/20 171 lb (77.6 kg)    General: Appears their stated age, well developed, well nourished in NAD. Skin: Warm, dry and intact. No rashes, lesions or ulcerations noted. HEENT: Head: normal shape and size; Eyes: sclera white, no icterus, conjunctiva pink, PERRLA and EOMs intact; Ears: Tm's gray and intact, normal light reflex; Nose: mucosa pink and moist, septum midline; Throat/Mouth: Teeth present, mucosa pink and moist, no exudate, lesions or ulcerations noted.  Neck:  Neck supple, trachea midline. No masses, lumps or thyromegaly present.  Cardiovascular: Normal rate and rhythm. S1,S2 noted.  No murmur, rubs or gallops noted. No JVD or BLE edema. No carotid bruits noted. Pulmonary/Chest: Normal effort and positive vesicular breath sounds. No respiratory distress. No wheezes, rales or ronchi noted.  Abdomen:  Soft and nontender. Normal bowel sounds. No distention or masses noted. Liver, spleen and kidneys non palpable. Musculoskeletal: Normal range of motion. No signs of joint swelling. No difficulty with gait.  Neurological: Alert and oriented. Cranial nerves II-XII grossly intact. Coordination normal.  Psychiatric: Mood and affect normal. Behavior is normal. Judgment and thought content normal.     BMET    Component Value Date/Time   NA 140 08/17/2020 1139   K 3.9 08/17/2020 1139   CL 109 08/17/2020 1139   CO2 26 08/17/2020 1139   GLUCOSE 81 08/17/2020 1139   BUN 13 08/17/2020 1139   CREATININE 0.66 08/17/2020 1139   CALCIUM 9.5 08/17/2020 1139   GFRNONAA 111 08/17/2020 1139   GFRAA 129 08/17/2020 1139    Lipid Panel  No results found for: CHOL,  TRIG, HDL, CHOLHDL, VLDL, LDLCALC  CBC    Component Value Date/Time   WBC 10.1 11/16/2020 0000   WBC 7.6 08/17/2020 1139   RBC 4.29 11/16/2020 0000   HGB 12.9 11/16/2020 0000   HCT 38 11/16/2020 0000   PLT 334 11/16/2020 0000   MCV 90.0 08/17/2020 1139   MCH 29.9 08/17/2020 1139   MCHC 33.2 08/17/2020 1139   RDW 12.0 08/17/2020 1139   LYMPHSABS 1,695 08/17/2020 1139   MONOABS 0.4 03/29/2019 0857   EOSABS 23 08/17/2020 1139   BASOSABS 30 08/17/2020 1139    Hgb A1C No results found for: HGBA1C         Assessment & Plan:   Nicki Reaper, NP This visit occurred during the SARS-CoV-2 public health emergency.  Safety protocols were in place, including screening questions prior to the visit, additional usage of staff PPE, and extensive cleaning of exam room while observing appropriate contact time as indicated for disinfecting solutions.

## 2021-01-25 ENCOUNTER — Ambulatory Visit (INDEPENDENT_AMBULATORY_CARE_PROVIDER_SITE_OTHER): Payer: BC Managed Care – PPO | Admitting: Surgery

## 2021-01-25 ENCOUNTER — Encounter: Payer: Self-pay | Admitting: Surgery

## 2021-01-25 ENCOUNTER — Other Ambulatory Visit: Payer: Self-pay

## 2021-01-25 VITALS — BP 124/85 | HR 85 | Temp 98.3°F | Wt 172.0 lb

## 2021-01-25 DIAGNOSIS — Z09 Encounter for follow-up examination after completed treatment for conditions other than malignant neoplasm: Secondary | ICD-10-CM

## 2021-01-25 DIAGNOSIS — N611 Abscess of the breast and nipple: Secondary | ICD-10-CM

## 2021-01-25 NOTE — Patient Instructions (Signed)
When dressing the area place a small piece of gauze between the skin and nipple. Use a small piece of gauze to do this.  Change daily.  Follow-up with our office as needed. We would be happy to see you if needed.  Please call and ask to speak with a nurse if you develop questions or concerns.

## 2021-01-25 NOTE — Progress Notes (Signed)
01/25/2021  HPI: Tammy Bennett is a 40 y.o. female s/p left breast abscess excision on 12/20/20.  Her wound unfortunately dehisced and she has been doing packing dressing changes.  She was last seen on 01/11/21, at which time her wound bed was healthy, without any significant fibrinous material, and without any evidence of infection.   Today, she's doing well, without any wound issues.  The wound is healing and she has continued with her dressing changes daily.  Vital signs: BP 124/85   Pulse 85   Temp 98.3 F (36.8 C)   Wt 172 lb (78 kg)   SpO2 97%   BMI 31.46 kg/m    Physical Exam: Constitutional:  No acute distress Breast:  Left breast wound is healing very well.  There's no longer any cavity and the wound bed has reached the skin surface.  Placed a dry gauze dressing.  No further packing is needed.  Assessment/Plan: This is a 40 y.o. female s/p left breast abscess excision, with post-op wound dehiscence.  --Patient is healing well, without complications.  The wound bed is healthy, with good granulation tissue, and now flush with the skin surface.  Discussed with the patient that no packing is needed and now can do only dry gauze dressing daily.  The skin will start growing over the wound. --Follow up as needed.   Howie Ill, MD [facility]

## 2021-01-31 ENCOUNTER — Other Ambulatory Visit: Payer: Self-pay

## 2021-01-31 ENCOUNTER — Encounter: Payer: Self-pay | Admitting: Obstetrics and Gynecology

## 2021-01-31 ENCOUNTER — Ambulatory Visit (INDEPENDENT_AMBULATORY_CARE_PROVIDER_SITE_OTHER): Payer: BC Managed Care – PPO | Admitting: Obstetrics and Gynecology

## 2021-01-31 VITALS — BP 120/72 | Ht 63.0 in | Wt 173.0 lb

## 2021-01-31 DIAGNOSIS — Z0000 Encounter for general adult medical examination without abnormal findings: Secondary | ICD-10-CM

## 2021-01-31 DIAGNOSIS — R232 Flushing: Secondary | ICD-10-CM

## 2021-01-31 DIAGNOSIS — R875 Abnormal microbiological findings in specimens from female genital organs: Secondary | ICD-10-CM

## 2021-01-31 DIAGNOSIS — Z113 Encounter for screening for infections with a predominantly sexual mode of transmission: Secondary | ICD-10-CM

## 2021-01-31 DIAGNOSIS — Z1239 Encounter for other screening for malignant neoplasm of breast: Secondary | ICD-10-CM

## 2021-01-31 DIAGNOSIS — Z30431 Encounter for routine checking of intrauterine contraceptive device: Secondary | ICD-10-CM

## 2021-01-31 DIAGNOSIS — Z124 Encounter for screening for malignant neoplasm of cervix: Secondary | ICD-10-CM | POA: Diagnosis not present

## 2021-01-31 DIAGNOSIS — N939 Abnormal uterine and vaginal bleeding, unspecified: Secondary | ICD-10-CM | POA: Diagnosis not present

## 2021-01-31 DIAGNOSIS — Z01419 Encounter for gynecological examination (general) (routine) without abnormal findings: Secondary | ICD-10-CM | POA: Diagnosis not present

## 2021-01-31 DIAGNOSIS — Z23 Encounter for immunization: Secondary | ICD-10-CM | POA: Diagnosis not present

## 2021-01-31 DIAGNOSIS — Z1231 Encounter for screening mammogram for malignant neoplasm of breast: Secondary | ICD-10-CM

## 2021-01-31 NOTE — Progress Notes (Signed)
Gynecology Annual Exam  PCP: Lorre Munroe, NP  Chief Complaint:  Chief Complaint  Patient presents with   Gynecologic Exam    History of Present Illness: Patient is a 40 y.o. Tammy Bennett presents for annual exam.   She reports that she is having an irregular menstrual cycle pattern.  She describes her bleeding as funky.  She reports that she will have 2 weeks of bleeding on and off.  The blood is generally light and she sees it with urination.  She is having cramps in her stomach and in her back.  LMP: Patient's last menstrual period was 01/25/2021. Average Interval: normally monthly, but recently have been irregular.  Duration of flow:  14  days Heavy Menses: no Dysmenorrhea: no  She denies passage of large clots She denies sensations of gushing or flooding of blood. She denies accidents where she bleeds through her clothing. She denies that she changes a saturated pad or tampon more frequently than every hour.  She denies that pain from her periods limits her activities.  The patient does perform self breast exams.  There is notable family history of breast or ovarian cancer in her family.  The patient reports her exercise generally consists of nothing .  The patient reports current symptoms of depression and anxiety.   PHQ-9: 3 GAD-7: 7   Review of Systems: Review of Systems  Constitutional:  Negative for chills, fever, malaise/fatigue and weight loss.  HENT:  Negative for congestion, hearing loss and sinus pain.   Eyes:  Negative for blurred vision and double vision.  Respiratory:  Negative for cough, sputum production, shortness of breath and wheezing.   Cardiovascular:  Negative for chest pain, palpitations, orthopnea and leg swelling.  Gastrointestinal:  Negative for abdominal pain, constipation, diarrhea, nausea and vomiting.  Genitourinary:  Negative for dysuria, flank pain, frequency, hematuria and urgency.  Musculoskeletal:  Negative for back pain, falls and  joint pain.  Skin:  Negative for itching and rash.  Neurological:  Negative for dizziness and headaches.  Psychiatric/Behavioral:  Negative for depression, substance abuse and suicidal ideas. The patient is not nervous/anxious.    Past Medical History:  Past Medical History:  Diagnosis Date   Allergy    Anemia    DURING PREGNANCY   Anxiety    Depression    Gastritis    GERD (gastroesophageal reflux disease)    IBS (irritable bowel syndrome)    Murmur    as a child    SVT (supraventricular tachycardia) (HCC)    Tendonitis    ARM RIGHT    Past Surgical History:  Past Surgical History:  Procedure Laterality Date   BREAST SURGERY     catheter abaltion to heart     Treated SVT as teenager   CHOLECYSTECTOMY     COLONOSCOPY WITH PROPOFOL N/A 05/30/2019   Procedure: COLONOSCOPY WITH PROPOFOL;  Surgeon: [name], MD;  Location: [facility] ENDOSCOPY;  Service: Gastroenterology;  Laterality: N/A;   EVACUATION BREAST HEMATOMA Right 01/12/2019   Procedure: RIGHT EXCISION NIPPLE ABSCESS;  Surgeon: Earline Mayotte, MD;  Location: [facility] ORS;  Service: General;  Laterality: Right;   MASS EXCISION Left 12/20/2020   Procedure: EXCISION left nipple abscess;  Surgeon: Henrene Dodge, MD;  Location: [facility] ORS;  Service: General;  Laterality: Left;   TONSILLECTOMY     as a child    TUBAL LIGATION      Gynecologic History:  Patient's last menstrual period was 01/25/2021. Menarche: 16  History of  fibroids, polyps, or ovarian cysts? : no  History of PCOS? no Hstory of Endometriosis? no History of abnormal pap smears? yes Have you had any sexually transmitted infections in the past? no  She denies HPV vaccination in the past. She would like to start HPV vacciantion series today.   Last Pap: Results were: 2020 NIL   She identifies as a female. She is sexually active with men.   She denies dyspareunia. She denies postcoital bleeding.  She currently uses IUD and tubal ligation for contraception.     Obstetric History: [id number]  Family History:  Family History  Problem Relation Age of Onset   Stroke Mother    Stomach cancer Maternal Grandmother    Leukemia Maternal Grandfather    Pancreatic cancer Paternal Grandmother    Healthy Brother    Sickle cell anemia Half-Sister    Crohn's disease Half-Sister    Ulcerative colitis Half-Sister    Thyroid disease Cousin    Breast cancer Maternal Aunt 53    Social History:  Social History   Socioeconomic History   Marital status: Married    Spouse name: Not on file   Number of children: Not on file   Years of education: Not on file   Highest education level: Not on file  Occupational History   Not on file  Tobacco Use   Smoking status: Never   Smokeless tobacco: Never  Vaping Use   Vaping Use: Never used  Substance and Sexual Activity   Alcohol use: Yes    Comment: OCC   Drug use: Not Currently    Types: Marijuana    Comment: none in years   Sexual activity: Yes    Birth control/protection: Surgical, I.U.D.    Comment: Tubal/ IUD  Other Topics Concern   Not on file  Social History Narrative   ** Merged History Encounter **       Social Determinants of Health   Financial Resource Strain: Not on file  Food Insecurity: Not on file  Transportation Needs: Not on file  Physical Activity: Not on file  Stress: Not on file  Social Connections: Not on file  Intimate Partner Violence: Not on file    Allergies:  Allergies  Allergen Reactions   Bacitracin Rash    Medications: Prior to Admission medications   Medication Sig Start Date End Date Taking? Authorizing Provider  hydrOXYzine (ATARAX/VISTARIL) 25 MG tablet Take 1 tablet (25 mg total) by mouth 2 (two) times daily as needed for anxiety. May take 1 additional pill per dose if acute panic attack. Max 3 pills in 24 hours Patient taking differently: Take 25-50 mg by mouth 2 (two) times daily as needed for anxiety. May take 1 additional pill per dose if acute  panic attack. Max 3 pills in 24 hours 08/17/20   Malfi, Jodelle Gross, FNP  levonorgestrel (MIRENA) 20 MCG/24HR IUD 1 each by Intrauterine route once.    [provider]  mirtazapine (REMERON) 7.5 MG tablet TAKE 1 TABLET(7.5 MG) BY MOUTH AT BEDTIME Patient taking differently: Take 7.5 mg by mouth at bedtime. 08/17/20   MalfiJodelle Gross, FNP    Physical Exam Vitals: Blood pressure 120/72, height 5\' 3"  (1.6 m), weight 173 lb (78.5 kg), last menstrual period 01/25/2021.  Physical Exam Constitutional:      Appearance: She is well-developed.  Genitourinary:     Genitourinary Comments: External: Normal appearing vulva. No lesions noted.  Speculum examination: Normal appearing cervix. No blood in the vaginal vault. No  discharge.  IUD strings seen Bimanual examination: Uterus midline, non-tender, normal in size, shape and contour.  No CMT. No adnexal masses. No adnexal tenderness. Pelvis not fixed.  Breast Exam: breast equal without skin changes, nipple discharge, breast lump or enlarged lymph nodes   HENT:     Head: Normocephalic and atraumatic.  Neck:     Thyroid: No thyromegaly.  Cardiovascular:     Rate and Rhythm: Normal rate and regular rhythm.     Heart sounds: Normal heart sounds.  Pulmonary:     Effort: Pulmonary effort is normal.     Breath sounds: Normal breath sounds.  Abdominal:     General: Bowel sounds are normal. There is no distension.     Palpations: Abdomen is soft. There is no mass.  Musculoskeletal:     Cervical back: Neck supple.  Neurological:     Mental Status: She is alert and oriented to person, place, and time.  Skin:    General: Skin is warm and dry.  Psychiatric:        Behavior: Behavior normal.        Thought Content: Thought content normal.        Judgment: Judgment normal.  Vitals reviewed.     Female chaperone present for pelvic and breast  portions of the physical exam  Assessment: 40 y.o. [id number] routine annual exam  Plan: Problem List  Items Addressed This Visit   None Visit Diagnoses     Encounter for annual routine gynecological examination    -  Primary   Health maintenance examination       Breast cancer screening by mammogram       Relevant Orders   MM 3D SCREEN BREAST BILATERAL   Cervical cancer screening       Encounter for gynecological examination without abnormal finding       Encounter for screening breast examination       IUD check up       Abnormal uterine bleeding       Relevant Orders   CBC   US PELVIC COMPLETE WITH TRANSVAGINAL   NuSwab Vaginitis Plus (VG+)   Hot flashes       Relevant Orders   TSH + free T4   FSH   Estradiol   Abnormal microbiological finding in specimen from female genital organ       Relevant Orders   NuSwab Vaginitis Plus (VG+)   Screen for STD (sexually transmitted disease)       Relevant Orders   NuSwab Vaginitis Plus (VG+)       1) STI screening was offered and declined  2) ASCCP guidelines and rational discussed.  Pap smear is up to date, next due in 2023.   3) Contraception - Continue IUD and tubal ligation.   4) Routine healthcare maintenance including cholesterol, diabetes screening discussed  - managed by her PCP  5) Irregular bleeding- labs today and pelvic US ordered, will plan GYN follow up.   6) HPV vaccination started, patient to follow up for 2nd shot in 2 months and 3rd shot in 6 months.   7) Mammogram ordered.  Adelene Idler MD, [facility], [facility] 01/31/2021 2:08 PM

## 2021-01-31 NOTE — Patient Instructions (Signed)
Institute of Medicine Recommended Dietary Allowances for Calcium and Vitamin D  Age (yr) Calcium Recommended Dietary Allowance (mg/day) Vitamin D Recommended Dietary Allowance (international units/day)  9-18 1,300 600  19-50 1,000 600  51-70 1,200 600  71 and older 1,200 800  Data from [employer] of [employer]. Dietary reference intakes: calcium, vitamin D. [place], [place]: National Academies Press; 2011.   Exercising to Stay Healthy To become healthy and stay healthy, it is recommended that you do moderate-intensity and vigorous-intensity exercise. You can tell that you are exercising at a moderate intensity if your heart starts beating faster and you start breathing faster but can still hold a conversation. You can tell that you are exercising at a vigorous intensity if you are breathing much harder andfaster and cannot hold a conversation while exercising. Exercising regularly is important. It has many health benefits, such as: Improving overall fitness, flexibility, and endurance. Increasing bone density. Helping with weight control. Decreasing body fat. Increasing muscle strength. Reducing stress and tension. Improving overall health. How often should I exercise? Choose an activity that you enjoy, and set realistic goals. Your health careprovider can help you make an activity plan that works for you. Exercise regularly as told by your health care provider. This may include: Doing strength training two times a week, such as: Lifting weights. Using resistance bands. Push-ups. Sit-ups. Yoga. Doing a certain intensity of exercise for a given amount of time. Choose from these options: A total of 150 minutes of moderate-intensity exercise every week. A total of 75 minutes of vigorous-intensity exercise every week. A mix of moderate-intensity and vigorous-intensity exercise every week. Children, pregnant women, people who have not exercised regularly, people who are overweight,  and older adults may need to talk with a health care provider about what activities are safe to do. If you have a medical condition, be sureto talk with your health care provider before you start a new exercise program. What are some exercise ideas? Moderate-intensity exercise ideas include: Walking 1 mile (1.6 km) in about 15 minutes. Biking. Hiking. Golfing. Dancing. Water aerobics. Vigorous-intensity exercise ideas include: Walking 4.5 miles (7.2 km) or more in about 1 hour. Jogging or running 5 miles (8 km) in about 1 hour. Biking 10 miles (16.1 km) or more in about 1 hour. Lap swimming. Roller-skating or in-line skating. Cross-country skiing. Vigorous competitive sports, such as football, basketball, and soccer. Jumping rope. Aerobic dancing. What are some everyday activities that can help me to get exercise? Yard work, such as: Pushing a lawn mower. Raking and bagging leaves. Washing your car. Pushing a stroller. Shoveling snow. Gardening. Washing windows or floors. How can I be more active in my day-to-day activities? Use stairs instead of an elevator. Take a walk during your lunch break. If you drive, park your car farther away from your work or school. If you take public transportation, get off one stop early and walk the rest of the way. Stand up or walk around during all of your indoor phone calls. Get up, stretch, and walk around every 30 minutes throughout the day. Enjoy exercise with a friend. Support to continue exercising will help you keep a regular routine of activity. What guidelines can I follow while exercising? Before you start a new exercise program, talk with your health care provider. Do not exercise so much that you hurt yourself, feel dizzy, or get very short of breath. Wear comfortable clothes and wear shoes with good support. Drink plenty of water while you exercise to prevent   dehydration or heat stroke. Work out until your breathing and your  heartbeat get faster. Where to find more information [place] Department of Health and Human Services: www.hhs.gov Centers for Disease Control and Prevention (CDC): www.cdc.gov Summary Exercising regularly is important. It will improve your overall fitness, flexibility, and endurance. Regular exercise also will improve your overall health. It can help you control your weight, reduce stress, and improve your bone density. Do not exercise so much that you hurt yourself, feel dizzy, or get very short of breath. Before you start a new exercise program, talk with your health care provider. This information is not intended to replace advice given to you by your health care provider. Make sure you discuss any questions you have with your healthcare provider. Document Revised: 07/13/2020 Document Reviewed: 07/13/2020 Elsevier Patient Education  2022 Else[employer] Inc. Budget-Friendly Healthy Eating There are many ways to save money at the grocery store and continue to eat healthy. You can be successful if you: Plan meals according to your budget. Make a grocery list and only purchase food according to your grocery list. Prepare food yourself at home. What are tips for following this plan? Reading food labels Compare food labels between brand name foods and the store brand. Often the nutritional value is the same, but the store brand is lower cost. Look for products that do not have added sugar, fat, or salt (sodium). These often cost the same but are healthier for you. Products may be labeled as: Sugar-free. Nonfat. Low-fat. Sodium-free. Low-sodium. Look for lean ground beef labeled as at least 92% lean and 8% fat. Shopping  Buy only the items on your grocery list and go only to the areas of the store that have the items on your list. Use coupons only for foods and brands you normally buy. Avoid buying items you wouldn't normally buy simply because they are on sale. Check online and in newspapers for  weekly deals. Buy healthy items from the bulk bins when available, such as herbs, spices, flour, pasta, nuts, and dried fruit. Buy fruits and vegetables that are in season. Prices are usually lower on in-season produce. Look at the unit price on the price tag. Use it to compare different brands and sizes to find out which item is the best deal. Choose healthy items that are often low-cost, such as carrots, potatoes, apples, bananas, and oranges. Dried or canned beans are a low-cost protein source. Buy in bulk and freeze extra food. Items you can buy in bulk include meats, fish, poultry, frozen fruits, and frozen vegetables. Avoid buying "ready-to-eat" foods, such as pre-cut fruits and vegetables and pre-made salads. If possible, shop around to discover where you can find the best prices. Consider other retailers such as dollar stores, larger wholesale stores, local fruit and vegetable stands, and farmers markets. Do not shop when you are hungry. If you shop while hungry, it may be hard to stick to your list and budget. Resist impulse buying. Use your grocery list as your official plan for the week. Buy a variety of vegetables and fruits by purchasing fresh, frozen, and canned items. Look at the top and bottom shelves for deals. Foods at eye level (eye level of an adult or child) are usually more expensive. Be efficient with your time when shopping. The more time you spend at the store, the more money you are likely to spend. To save money when choosing more expensive foods like meats and dairy: Choose cheaper cuts of meat, such as bone-in   chicken thighs and drumsticks instead of skinless and boneless chicken. When you are ready to prepare the chicken, you can remove the skin yourself to make it healthier. Choose lean meats like chicken or turkey instead of beef. Choose canned seafood, such as tuna, salmon, or sardines. Buy eggs as a low-cost source of protein. Buy dried beans and peas, such as  lentils, split peas, or kidney beans instead of meats. Dried beans and peas are a good alternative source of protein. Buy the larger tubs of yogurt instead of individual-sized containers. Choose water instead of sodas and other sweetened beverages. Avoid buying chips, cookies, and other "junk food." These items are usually expensive and not healthy.  Cooking Make extra food and freeze the extras in meal-sized containers or in individual portions for fast meals and snacks. Pre-cook on days when you have extra time to prepare meals in advance. You can keep these meals in the fridge or freezer and reheat for a quick meal. When you come home from the grocery store, wash, peel, and cut fruits and vegetables so they are ready to use and eat. This will help reduce food waste. Meal planning Do not eat out or get fast food. Prepare food at home. Make a grocery list and make sure to bring it with you to the store. If you have a smart phone, you could use your phone to create your shopping list. Plan meals and snacks according to a grocery list and budget you create. Use leftovers in your meal plan for the week. Look for recipes where you can cook once and make enough food for two meals. Prepare budget-friendly types of meals like stews, casseroles, and stir-fry dishes. Try some meatless meals or try "no cook" meals like salads. Make sure that half your plate is filled with fruits or vegetables. Choose from fresh, frozen, or canned fruits and vegetables. If eating canned, remember to rinse them before eating. This will remove any excess salt added for packaging. Summary Eating healthy on a budget is possible if you plan your meals according to your budget, purchase according to your budget and grocery list, and prepare food yourself. Tips for buying more food on a limited budget include buying generic brands, using coupons only for foods you normally buy, and buying healthy items from the bulk bins when  available. Tips for buying cheaper food to replace expensive food include choosing cheaper, lean cuts of meat, and buying dried beans and peas. This information is not intended to replace advice given to you by your health care provider. Make sure you discuss any questions you have with your healthcare provider. Document Revised: 05/10/2020 Document Reviewed: 05/10/2020 Elsevier Patient Education  2022 Elsevier Inc. Bone Health Bones protect organs, store calcium, anchor muscles, and support the whole body. Keeping your bones strong is important, especially as you get older. Youcan take actions to help keep your bones strong and healthy. Why is keeping my bones healthy important?  Keeping your bones healthy is important because your body constantly replaces bone cells. Cells get old, and new cells take their place. As we age, we lose bone cells because the body may not be able to make enough new cells to replace the old cells. The amount of bone cells and bone tissue you have is referred toas bone mass. The higher your bone mass, the stronger your bones. The aging process leads to an overall loss of bone mass in the body, which can increase the likelihood of: Joint pain   and stiffness. Broken bones. A condition in which the bones become weak and brittle (osteoporosis). A large decline in bone mass occurs in older adults. In women, it occurs aboutthe time of menopause. What actions can I take to keep my bones healthy? Good health habits are important for maintaining healthy bones. This includes eating nutritious foods and exercising regularly. To have healthy bones, you need to get enough of the right minerals and vitamins. Most nutrition experts recommend getting these nutrients from the foods that you eat. In some cases, taking supplements may also be recommended. Doing certain types of exercise isalso important for bone health. What are the nutritional recommendations for healthy bones?  Eating  a well-balanced diet with plenty of calcium and vitamin D will help to protect your bones. Nutritional recommendations vary from person to person. Ask your health care provider what is healthy for you. Here are some generalguidelines. Get enough calcium Calcium is the most important (essential) mineral for bone health. Most people can get enough calcium from their diet, but supplements may be recommended for people who are at risk for osteoporosis. Good sources of calcium include: Dairy products, such as low-fat or nonfat milk, cheese, and yogurt. Dark green leafy vegetables, such as bok choy and broccoli. Calcium-fortified foods, such as orange juice, cereal, bread, soy beverages, and tofu products. Nuts, such as almonds. Follow these recommended amounts for daily calcium intake: Children, age 1-3: 700 mg. Children, age 4-8: 1,000 mg. Children, age 9-13: 1,300 mg. Teens, age 14-18: 1,300 mg. Adults, age 19-50: 1,000 mg. Adults, age 51-70: Men: 1,000 mg. Women: 1,200 mg. Adults, age 71 or older: 1,200 mg. Pregnant and breastfeeding females: Teens: 1,300 mg. Adults: 1,000 mg. Get enough vitamin D Vitamin D is the most essential vitamin for bone health. It helps the body absorb calcium. Sunlight stimulates the skin to make vitamin D, so be sure to get enough sunlight. If you live in a cold climate or you do not get outside often, your health care provider may recommend that you take vitamin D supplements. Good sources of vitamin D in your diet include: Egg yolks. Saltwater fish. Milk and cereal fortified with vitamin D. Follow these recommended amounts for daily vitamin D intake: Children and teens, age 1-18: 600 international units. Adults, age 50 or younger: 400-800 international units. Adults, age 51 or older: 800-1,000 international units. Get other important nutrients Other nutrients that are important for bone health include: Phosphorus. This mineral is found in meat, poultry,  dairy foods, nuts, and legumes. The recommended daily intake for adult men and adult women is 700 mg. Magnesium. This mineral is found in seeds, nuts, dark green vegetables, and legumes. The recommended daily intake for adult men is 400-420 mg. For adult women, it is 310-320 mg. Vitamin K. This vitamin is found in green leafy vegetables. The recommended daily intake is 120 mg for adult men and 90 mg for adult women. What type of physical activity is best for building and maintaining healthybones? Weight-bearing and strength-building activities are important for building and maintaining healthy bones. Weight-bearing activities cause muscles and bones to work against gravity. Strength-building activities increase the strength of the muscles that support bones. Weight-bearing and muscle-building activities include: Walking and hiking. Jogging and running. Dancing. Gym exercises. Lifting weights. Tennis and racquetball. Climbing stairs. Aerobics. Adults should get at least 30 minutes of moderate physical activity on most days. Children should get at least 60 minutes of moderate physical activity onmost days. Ask your health care   provider what type of exercise is best for you. How can I find out if my bone mass is low? Bone mass can be measured with an X-ray test called a bone mineral density (BMD) test. This test is recommended for all women who are age 65 or older. It may also be recommended for: Men who are age 70 or older. People who are at risk for osteoporosis because of: Having bones that break easily. Having a long-term disease that weakens bones, such as kidney disease or rheumatoid arthritis. Having menopause earlier than normal. Taking medicine that weakens bones, such as steroids, thyroid hormones, or hormone treatment for breast cancer or prostate cancer. Smoking. Drinking three or more alcoholic drinks a day. If you find that you have a low bone mass, you may be able to  preventosteoporosis or further bone loss by changing your diet and lifestyle. Where can I find more information? For more information, check out the following websites: National Osteoporosis Foundation: www.nof.org/patients National Institutes of Health: www.bones.nih.gov International Osteoporosis Foundation: www.iofbonehealth.org Summary The aging process leads to an overall loss of bone mass in the body, which can increase the likelihood of broken bones and osteoporosis. Eating a well-balanced diet with plenty of calcium and vitamin D will help to protect your bones. Weight-bearing and strength-building activities are also important for building and maintaining strong bones. Bone mass can be measured with an X-ray test called a bone mineral density (BMD) test. This information is not intended to replace advice given to you by your health care provider. Make sure you discuss any questions you have with your healthcare provider. Document Revised: 08/24/2017 Document Reviewed: 08/24/2017 Elsevier Patient Education  2022 Elsevier Inc. Managing Anxiety, Adult After being diagnosed with an anxiety disorder, you may be relieved to know why you have felt or behaved a certain way. You may also feel overwhelmed about the treatment ahead and what it will mean for your life. With care and support, youcan manage this condition and recover from it. How to manage lifestyle changes Managing stress and anxiety  Stress is your body's reaction to life changes and events, both good and bad. Most stress will last just a few hours, but stress can be ongoing and can lead to more than just stress. Although stress can play a major role in anxiety, it is not the same as anxiety. Stress is usually caused by something external, such as a deadline, test, or competition. Stress normally passes after thetriggering event has ended.  Anxiety is caused by something internal, such as imagining a terrible outcome or worrying  that something will go wrong that will devastate you. Anxiety often does not go away even after the triggering event is over, and it can become long-term (chronic) worry. It is important to understand the differences between stress and anxiety and to manage your stress effectively so that it does not lead to ananxious response. Talk with your health care provider or a counselor to learn more about reducing anxiety and stress. He or she may suggest tension reduction techniques, such as: Music therapy. This can include creating or listening to music that you enjoy and that inspires you. Mindfulness-based meditation. This involves being aware of your normal breaths while not trying to control your breathing. It can be done while sitting or walking. Centering prayer. This involves focusing on a word, phrase, or sacred image that means something to you and brings you peace. Deep breathing. To do this, expand your stomach and inhale slowly through your   nose. Hold your breath for 3-5 seconds. Then exhale slowly, letting your stomach muscles relax. Self-talk. This involves identifying thought patterns that lead to anxiety reactions and changing those patterns. Muscle relaxation. This involves tensing muscles and then relaxing them. Choose a tension reduction technique that suits your lifestyle and personality. These techniques take time and practice. Set aside 5-15 minutes a day to do them. Therapists can offer counseling and training in these techniques. The training to help with anxiety may be covered by some insurance plans. Other things you can do to manage stress and anxiety include: Keeping a stress/anxiety diary. This can help you learn what triggers your reaction and then learn ways to manage your response. Thinking about how you react to certain situations. You may not be able to control everything, but you can control your response. Making time for activities that help you relax and not feeling guilty  about spending your time in this way. Visual imagery and yoga can help you stay calm and relax.  Medicines Medicines can help ease symptoms. Medicines for anxiety include: Anti-anxiety drugs. Antidepressants. Medicines are often used as a primary treatment for anxiety disorder. Medicines will be prescribed by a health care provider. When used together, medicines, psychotherapy, and tension reduction techniques may be the most effectivetreatment. Relationships Relationships can play a big part in helping you recover. Try to spend more time connecting with trusted friends and family members. Consider going to couples counseling, taking family education classes, or going to familytherapy. Therapy can help you and others better understand your condition. How to recognize changes in your anxiety Everyone responds differently to treatment for anxiety. Recovery from anxiety happens when symptoms decrease and stop interfering with your daily activities at home or work. This may mean that you will start to: Have better concentration and focus. Worry will interfere less in your daily thinking. Sleep better. Be less irritable. Have more energy. Have improved memory. It is important to recognize when your condition is getting worse. Contact your health care provider if your symptoms interfere with home or work and you feellike your condition is not improving. Follow these instructions at home: Activity Exercise. Most adults should do the following: Exercise for at least 150 minutes each week. The exercise should increase your heart rate and make you sweat (moderate-intensity exercise). Strengthening exercises at least twice a week. Get the right amount and quality of sleep. Most adults need 7-9 hours of sleep each night. Lifestyle  Eat a healthy diet that includes plenty of vegetables, fruits, whole grains, low-fat dairy products, and lean protein. Do not eat a lot of foods that are high in solid fats,  added sugars, or salt. Make choices that simplify your life. Do not use any products that contain nicotine or tobacco, such as cigarettes, e-cigarettes, and chewing tobacco. If you need help quitting, ask your health care provider. Avoid caffeine, alcohol, and certain over-the-counter cold medicines. These may make you feel worse. Ask your pharmacist which medicines to avoid.  General instructions Take over-the-counter and prescription medicines only as told by your health care provider. Keep all follow-up visits as told by your health care provider. This is important. Where to find support You can get help and support from these sources: Self-help groups. Online and community organizations. A trusted spiritual leader. Couples counseling. Family education classes. Family therapy. Where to find more information You may find that joining a support group helps you deal with your anxiety. The following sources can help you locate counselors   or support groups near you: Mental Health America: www.mentalhealthamerica.net Anxiety and Depression Association of America (ADAA): www.adaa.org National Alliance on Mental Illness (NAMI): www.nami.org Contact a health care provider if you: Have a hard time staying focused or finishing daily tasks. Spend many hours a day feeling worried about everyday life. Become exhausted by worry. Start to have headaches, feel tense, or have nausea. Urinate more than normal. Have diarrhea. Get help right away if you have: A racing heart and shortness of breath. Thoughts of hurting yourself or others. If you ever feel like you may hurt yourself or others, or have thoughts about taking your own life, get help right away. You can go to your nearest emergency department or call: Your local emergency services (911 in the [place] A suicide crisis helpline, such as the National Suicide Prevention Lifeline at 1-800-273-8255. This is open 24 hours a day. Summary Taking  steps to learn and use tension reduction techniques can help calm you and help prevent triggering an anxiety reaction. When used together, medicines, psychotherapy, and tension reduction techniques may be the most effective treatment. Family, friends, and partners can play a big part in helping you recover from an anxiety disorder. This information is not intended to replace advice given to you by your health care provider. Make sure you discuss any questions you have with your healthcare provider. Document Revised: 12/28/2018 Document Reviewed: 12/28/2018 Elsevier Patient Education  2022 Elsevier Inc.  

## 2021-02-01 LAB — TSH+FREE T4
Free T4: 1.13 ng/dL (ref 0.82–1.77)
TSH: 1.15 u[IU]/mL (ref 0.450–4.500)

## 2021-02-01 LAB — CBC
Hematocrit: 37.6 % (ref 34.0–46.6)
Hemoglobin: 12.7 g/dL (ref 11.1–15.9)
MCH: 30.2 pg (ref 26.6–33.0)
MCHC: 33.8 g/dL (ref 31.5–35.7)
MCV: 90 fL (ref 79–97)
Platelets: 351 10*3/uL (ref 150–450)
RBC: 4.2 x10E6/uL (ref 3.77–5.28)
RDW: 12 % (ref 11.7–15.4)
WBC: 8 10*3/uL (ref 3.4–10.8)

## 2021-02-01 LAB — FOLLICLE STIMULATING HORMONE: FSH: 7.5 m[IU]/mL

## 2021-02-01 LAB — ESTRADIOL: Estradiol: 59.4 pg/mL

## 2021-02-03 ENCOUNTER — Encounter: Payer: Self-pay | Admitting: Obstetrics and Gynecology

## 2021-02-03 ENCOUNTER — Other Ambulatory Visit: Payer: Self-pay | Admitting: Obstetrics and Gynecology

## 2021-02-03 DIAGNOSIS — Z124 Encounter for screening for malignant neoplasm of cervix: Secondary | ICD-10-CM | POA: Diagnosis not present

## 2021-02-03 DIAGNOSIS — Z23 Encounter for immunization: Secondary | ICD-10-CM | POA: Diagnosis not present

## 2021-02-03 DIAGNOSIS — B9689 Other specified bacterial agents as the cause of diseases classified elsewhere: Secondary | ICD-10-CM

## 2021-02-03 LAB — NUSWAB VAGINITIS PLUS (VG+)
Atopobium vaginae: HIGH Score — AB
Candida albicans, NAA: NEGATIVE
Candida glabrata, NAA: NEGATIVE
Chlamydia trachomatis, NAA: NEGATIVE
Megasphaera 1: HIGH Score — AB
Neisseria gonorrhoeae, NAA: NEGATIVE
Trich vag by NAA: NEGATIVE

## 2021-02-03 MED ORDER — METRONIDAZOLE 500 MG PO TABS: 500.0000 mg | ORAL_TABLET | Freq: Two times a day (BID) | ORAL | 0 refills | Status: DC

## 2021-02-08 ENCOUNTER — Encounter: Payer: Self-pay | Admitting: Surgery

## 2021-02-28 ENCOUNTER — Ambulatory Visit: Payer: BC Managed Care – PPO | Admitting: Obstetrics and Gynecology

## 2021-03-07 ENCOUNTER — Ambulatory Visit
Admission: RE | Admit: 2021-03-07 | Discharge: 2021-03-07 | Disposition: A | Discharge status: Home / Self Care | Payer: BC Managed Care – PPO | Source: Ambulatory Visit | Attending: Obstetrics and Gynecology | Admitting: Obstetrics and Gynecology

## 2021-03-07 ENCOUNTER — Other Ambulatory Visit: Payer: Self-pay

## 2021-03-07 DIAGNOSIS — N939 Abnormal uterine and vaginal bleeding, unspecified: Secondary | ICD-10-CM | POA: Insufficient documentation

## 2021-03-07 DIAGNOSIS — D251 Intramural leiomyoma of uterus: Secondary | ICD-10-CM | POA: Diagnosis not present

## 2021-03-12 ENCOUNTER — Ambulatory Visit: Payer: BC Managed Care – PPO | Admitting: Obstetrics and Gynecology

## 2021-03-28 ENCOUNTER — Encounter: Payer: Self-pay | Admitting: Internal Medicine

## 2021-03-29 ENCOUNTER — Telehealth: Payer: BC Managed Care – PPO

## 2021-04-01 ENCOUNTER — Ambulatory Visit: Payer: BC Managed Care – PPO | Admitting: Obstetrics and Gynecology

## 2021-04-02 ENCOUNTER — Telehealth: Payer: BC Managed Care – PPO | Admitting: Emergency Medicine

## 2021-04-02 DIAGNOSIS — U071 COVID-19: Secondary | ICD-10-CM | POA: Diagnosis not present

## 2021-04-02 MED ORDER — ALBUTEROL SULFATE HFA 108 (90 BASE) MCG/ACT IN AERS: 2.0000 | INHALATION_SPRAY | RESPIRATORY_TRACT | 0 refills | Status: AC | PRN

## 2021-04-02 NOTE — Patient Instructions (Signed)
Please take the inhaler every 4 hours as needed.  If you do not improve in the next day or so, or if you worsen, you should be seen in person.

## 2021-04-02 NOTE — Progress Notes (Signed)
Virtual Visit Consent   Tammy Bennett, you are scheduled for a virtual visit with a [facility] provider today.     Just as with appointments in the office, your consent must be obtained to participate.  Your consent will be active for this visit and any virtual visit you may have with one of our providers in the next 365 days.     If you have a MyChart account, a copy of this consent can be sent to you electronically.  All virtual visits are billed to your insurance company just like a traditional visit in the office.    As this is a virtual visit, video technology does not allow for your provider to perform a traditional examination.  This may limit your provider's ability to fully assess your condition.  If your provider identifies any concerns that need to be evaluated in person or the need to arrange testing (such as labs, EKG, etc.), we will make arrangements to do so.     Although advances in technology are sophisticated, we cannot ensure that it will always work on either your end or our end.  If the connection with a video visit is poor, the visit may have to be switched to a telephone visit.  With either a video or telephone visit, we are not always able to ensure that we have a secure connection.     I need to obtain your verbal consent now.   Are you willing to proceed with your visit today?    Tammy Bennett has provided verbal consent on 04/02/2021 for a virtual visit video.   Roxy Horseman, PA-C   Date: 04/02/2021 1:00 PM   Virtual Visit via Video Note   I, Roxy Horseman, connected with  Tammy Bennett  ([id number], 1981-06-25) on 04/02/21 at  1:00 PM EDT by a video-enabled telemedicine application and verified that I am speaking with the correct person using two identifiers.  Location: Patient: Virtual Visit Location Patient: Home Provider: Virtual Visit Location Provider: Home Office   I discussed the limitations of evaluation and management by telemedicine  and the availability of in person appointments. The patient expressed understanding and agreed to proceed.    History of Present Illness: Tammy Bennett is a 40 y.o. who identifies as a female who was assigned female at birth, and is being seen today for chest tightness thought 2/2 COVID.  Was diagnosed last Thursday.  She denies any wheezing.  States that she gets winded from being on the phone for work.  She states that overall she thinks that she has been improving with the exception of fatigue.  Denies hx of asthma.  Denies ever having had a blood clot.  HPI: HPI  Problems:  Patient Active Problem List   Diagnosis Date Noted   Psychophysiological insomnia 08/17/2020   Severe anxiety with panic 10/13/2018    Allergies:  Allergies  Allergen Reactions   Bacitracin Rash   Medications:  Current Outpatient Medications:    hydrOXYzine (ATARAX/VISTARIL) 25 MG tablet, Take 1 tablet (25 mg total) by mouth 2 (two) times daily as needed for anxiety. May take 1 additional pill per dose if acute panic attack. Max 3 pills in 24 hours (Patient not taking: Reported on 01/31/2021), Disp: 30 tablet, Rfl: 2   levonorgestrel (MIRENA) 20 MCG/24HR IUD, 1 each by Intrauterine route once. (Patient not taking: Reported on 01/31/2021), Disp: , Rfl:    metroNIDAZOLE (FLAGYL) 500 MG tablet, Take 1 tablet (500 mg  total) by mouth 2 (two) times daily., Disp: 14 tablet, Rfl: 0   mirtazapine (REMERON) 7.5 MG tablet, TAKE 1 TABLET(7.5 MG) BY MOUTH AT BEDTIME (Patient not taking: Reported on 01/31/2021), Disp: 90 tablet, Rfl: 1  Observations/Objective: Patient is well-developed, well-nourished in no acute distress.  Resting comfortably  at home.  Head is normocephalic, atraumatic.  No labored breathing.  Speech is clear and coherent with logical content.  Patient is alert and oriented at baseline.    Assessment and Plan: 1. COVID-19  - MDI inhaler for chest tightness -Continue home remedies -F/u if  worsening.  Follow Up Instructions: I discussed the assessment and treatment plan with the patient. The patient was provided an opportunity to ask questions and all were answered. The patient agreed with the plan and demonstrated an understanding of the instructions.  A copy of instructions were sent to the patient via MyChart.  The patient was advised to call back or seek an in-person evaluation if the symptoms worsen or if the condition fails to improve as anticipated.  Time:  I spent 13 minutes with the patient via telehealth technology discussing the above problems/concerns.    Roxy Horseman, PA-C

## 2021-04-04 ENCOUNTER — Ambulatory Visit: Payer: BC Managed Care – PPO

## 2021-04-06 DIAGNOSIS — Z20822 Contact with and (suspected) exposure to covid-19: Secondary | ICD-10-CM | POA: Diagnosis not present

## 2021-04-08 ENCOUNTER — Encounter: Payer: Self-pay | Admitting: Surgery

## 2021-04-08 ENCOUNTER — Encounter: Payer: Self-pay | Admitting: Internal Medicine

## 2021-04-08 ENCOUNTER — Other Ambulatory Visit: Payer: Self-pay

## 2021-04-08 ENCOUNTER — Ambulatory Visit (INDEPENDENT_AMBULATORY_CARE_PROVIDER_SITE_OTHER): Payer: BC Managed Care – PPO | Admitting: Surgery

## 2021-04-08 ENCOUNTER — Telehealth: Payer: Self-pay

## 2021-04-08 VITALS — BP 120/82 | HR 96 | Temp 98.7°F | Ht 62.0 in | Wt 166.6 lb

## 2021-04-08 DIAGNOSIS — N611 Abscess of the breast and nipple: Secondary | ICD-10-CM | POA: Diagnosis not present

## 2021-04-08 MED ORDER — OXYCODONE HCL 5 MG PO TABS: 5.0000 mg | ORAL_TABLET | Freq: Four times a day (QID) | ORAL | 0 refills | Status: DC | PRN

## 2021-04-08 MED ORDER — AMOXICILLIN-POT CLAVULANATE 875-125 MG PO TABS: 1.0000 | ORAL_TABLET | Freq: Two times a day (BID) | ORAL | 0 refills | Status: AC

## 2021-04-08 NOTE — Progress Notes (Signed)
04/08/2021  History of Present Illness: Tammy Bennett is a 40 y.o. female s/p left breast cyst excision on 12/20/20.  She had a post-op wound dehiscence which healed by secondary intention by the end of June.  She reports that a few days ago, she started having more discomfort in the same location and then over the weekend, the wound opened up and drained purulent fluid. She also reports that the wound feels harder now, and before this started again, the area was softer.  Denies any fevers but has not checked them at home.  Past Medical History: Past Medical History:  Diagnosis Date   Allergy    Anemia    DURING PREGNANCY   Anxiety    Depression    Gastritis    GERD (gastroesophageal reflux disease)    IBS (irritable bowel syndrome)    Murmur    as a child    SVT (supraventricular tachycardia) (HCC)    Tendonitis    ARM RIGHT     Past Surgical History: Past Surgical History:  Procedure Laterality Date   BREAST SURGERY     catheter abaltion to heart     Treated SVT as teenager   CHOLECYSTECTOMY     COLONOSCOPY WITH PROPOFOL N/A 05/30/2019   Procedure: COLONOSCOPY WITH PROPOFOL;  Surgeon: [name], MD;  [facility] ENDOSCOPY;  Service: Gastroenterology;  Laterality: N/A;   EVACUATION BREAST HEMATOMA Right 01/12/2019   Procedure: RIGHT EXCISION NIPPLE ABSCESS;  Surgeon: Earline Mayotte, MD;  Location: [facility] ORS;  Service: General;  Laterality: Right;   MASS EXCISION Left 12/20/2020   Procedure: EXCISION left nipple abscess;  Surgeon: Henrene Dodge, MD;  Location: [facility] ORS;  Service: General;  Laterality: Left;   TONSILLECTOMY     as a child    TUBAL LIGATION      Home Medications: Prior to Admission medications   Medication Sig Start Date End Date Taking? Authorizing Provider  albuterol (VENTOLIN HFA) 108 (90 Base) MCG/ACT inhaler Inhale 2 puffs into the lungs every 4 (four) hours as needed for wheezing or shortness of breath. 04/02/21  Yes Roxy Horseman, PA-C   amoxicillin-clavulanate (AUGMENTIN) 875-125 MG tablet Take 1 tablet by mouth 2 (two) times daily for 7 days. 04/08/21 04/15/21 Yes Rosena Bartle, Elita Quick, MD  hydrOXYzine (ATARAX/VISTARIL) 25 MG tablet Take 1 tablet (25 mg total) by mouth 2 (two) times daily as needed for anxiety. May take 1 additional pill per dose if acute panic attack. Max 3 pills in 24 hours 08/17/20  Yes Malfi, Jodelle Gross, FNP  levonorgestrel (MIRENA) 20 MCG/24HR IUD 1 each by Intrauterine route once.   Yes [provider]  mirtazapine (REMERON) 7.5 MG tablet TAKE 1 TABLET(7.5 MG) BY MOUTH AT BEDTIME 08/17/20  Yes Malfi, Jodelle Gross, FNP  oxyCODONE (ROXICODONE) 5 MG immediate release tablet Take 1 tablet (5 mg total) by mouth every 6 (six) hours as needed for severe pain. 04/08/21 04/08/22 Yes Henrene Dodge, MD    Allergies: Allergies  Allergen Reactions   Bacitracin Rash    Review of Systems: Review of Systems  Constitutional:  Negative for chills and fever.  Respiratory:  Negative for shortness of breath.   Cardiovascular:  Negative for chest pain.  Gastrointestinal:  Negative for nausea and vomiting.  Skin:        Left breast open wound   Physical Exam BP 120/82   Pulse 96   Temp 98.7 F (37.1 C) (Oral)   Ht 5\' 2"  (1.575 m)   Wt  166 lb 9.6 oz (75.6 kg)   SpO2 98%   BMI 30.47 kg/m  CONSTITUTIONAL: No acute distress HEENT:  Normocephalic, atraumatic, extraocular motion intact. RESPIRATORY:  Normal respiratory effort without pathologic use of accessory muscles. CARDIOVASCULAR: Regular rhythm and rate. BREAST:  Left breast with open wound in the areola at the 9 o'clock position.  The wound measures about 7 x 7 x 7 mm in size.  Probing with qtip does not reveal a deeper wound or pocket of purulent fluid.  The wound bed is not necrotic and has healthy granulation tissue.  The wound was packed with 1/4 inch gauze and dressed with 4x4 gauze and tape. NEUROLOGIC:  Motor and sensation is grossly normal.  Cranial nerves are  grossly intact. PSYCH:  Alert and oriented to person, place and time. Affect is normal.   Assessment and Plan: This is a 40 y.o. female with left breast wound.  --Discussed with the patient that probably there was a small cavity within the wound that did not close all the way and it got infected.  Unclear as to why this is happening 2 months after the wound had fully healed.  For now, instructed the patient on doing packing wound dressing changes daily with 1/4 inch gauze.  Will also give her a prescription for Augmentin for 7 day course, and a prescription for oxycodone 5 mg x 15 tab for pain control. --Follow up next week for wound check  Face-to-face time spent with the patient and care providers was 15 minutes, with more than 50% of the time spent counseling, educating, and coordinating care of the patient.     Howie Ill, MD [facility]

## 2021-04-08 NOTE — Telephone Encounter (Signed)
Excision left nipple abscess 12/20/2020-incison has opened-pus drainage and requesting to be seen -spoke with Dr.Piscoya add patient on for 2 pm today.

## 2021-04-08 NOTE — Patient Instructions (Signed)
You will need to pack the wound daily. Please see your follow up appointment listed below.

## 2021-04-09 ENCOUNTER — Telehealth: Payer: Self-pay | Admitting: *Deleted

## 2021-04-09 NOTE — Telephone Encounter (Signed)
Faxed FMLA to Marlyce Huge at [employer] [phone]

## 2021-04-19 ENCOUNTER — Ambulatory Visit: Payer: BC Managed Care – PPO | Admitting: Surgery

## 2021-07-09 ENCOUNTER — Encounter: Payer: Self-pay | Admitting: Obstetrics and Gynecology

## 2021-07-11 ENCOUNTER — Telehealth: Payer: Self-pay

## 2021-07-11 NOTE — Telephone Encounter (Signed)
Noted. Mirena reserved for this patient. 

## 2021-07-11 NOTE — Telephone Encounter (Signed)
Patient requested IUD replacement for Mirena which she has had for 6 years. Patient states she is having mulitple cycles in the month and wishes to have it replaced. Patient is scheduled for 07/12/21 at 1:30 with [facility]

## 2021-07-12 ENCOUNTER — Encounter: Payer: Self-pay | Admitting: Obstetrics and Gynecology

## 2021-07-12 ENCOUNTER — Other Ambulatory Visit: Payer: Self-pay

## 2021-07-12 ENCOUNTER — Ambulatory Visit: Payer: BC Managed Care – PPO | Admitting: Obstetrics and Gynecology

## 2021-07-12 VITALS — BP 130/72 | Ht 62.0 in | Wt 174.6 lb

## 2021-07-12 DIAGNOSIS — F419 Anxiety disorder, unspecified: Secondary | ICD-10-CM

## 2021-07-12 DIAGNOSIS — Z3040 Encounter for surveillance of contraceptives, unspecified: Secondary | ICD-10-CM

## 2021-07-12 DIAGNOSIS — N926 Irregular menstruation, unspecified: Secondary | ICD-10-CM | POA: Diagnosis not present

## 2021-07-12 LAB — POCT URINE PREGNANCY: Preg Test, Ur: NEGATIVE

## 2021-07-12 MED ORDER — BUSPIRONE HCL 5 MG PO TABS
5.0000 mg | ORAL_TABLET | Freq: Three times a day (TID) | ORAL | 3 refills | Status: DC | PRN
Start: 2021-07-12 — End: 2023-04-23

## 2021-07-12 MED ORDER — PAROXETINE HCL 10 MG PO TABS
10.0000 mg | ORAL_TABLET | Freq: Every day | ORAL | 2 refills | Status: DC
Start: 2021-07-12 — End: 2023-04-23

## 2021-07-12 MED ORDER — ESTRADIOL 1 MG PO TABS: 1.0000 mg | ORAL_TABLET | Freq: Every day | ORAL | 0 refills | Status: DC

## 2021-07-12 NOTE — Progress Notes (Signed)
Patient ID: Tammy Bennett, female   DOB: March 15, 1981, 40 y.o.   MRN: [id number]  Reason for Consult: Gynecologic Exam   Referred by Jearld Fenton, NP  Subjective:     HPI:  Tammy Bennett is a 40 y.o. female. She has had her current IUD in place for 6 yeaars. She reports she has been having spotting for 14 + days, sometimes two "periods" a month- not heavy, fills a liner  She also reports mood liability and ongoing vasomotor symptoms.  GAD 7: 14 PHQ-9: 7  Gynecological History  Patient's last menstrual period was 07/02/2021.  Past Medical History:  Diagnosis Date   Allergy    Anemia    DURING PREGNANCY   Anxiety    Depression    Gastritis    GERD (gastroesophageal reflux disease)    IBS (irritable bowel syndrome)    Murmur    as a child    SVT (supraventricular tachycardia) (HCC)    Tendonitis    ARM RIGHT   Family History  Problem Relation Age of Onset   Stroke Mother    Stomach cancer Maternal Grandmother    Leukemia Maternal Grandfather    Pancreatic cancer Paternal Grandmother    Healthy Brother    Sickle cell anemia Half-Sister    Crohn's disease Half-Sister    Ulcerative colitis Half-Sister    Thyroid disease Cousin    Breast cancer Maternal Aunt 48   Past Surgical History:  Procedure Laterality Date   BREAST SURGERY     catheter abaltion to heart     Treated SVT as teenager   CHOLECYSTECTOMY     COLONOSCOPY WITH PROPOFOL N/A 05/30/2019   Procedure: COLONOSCOPY WITH PROPOFOL;  Surgeon: Jonathon Bellows, MD;  Location: [facility] ENDOSCOPY;  Service: Gastroenterology;  Laterality: N/A;   EVACUATION BREAST HEMATOMA Right 01/12/2019   Procedure: RIGHT EXCISION NIPPLE ABSCESS;  Surgeon: Robert Bellow, MD;  Location: [facility] ORS;  Service: General;  Laterality: Right;   MASS EXCISION Left 12/20/2020   Procedure: EXCISION left nipple abscess;  Surgeon: Olean Ree, MD;  Location: [facility] ORS;  Service: General;  Laterality: Left;   TONSILLECTOMY     as a  child    TUBAL LIGATION      Short Social History:  Social History   Tobacco Use   Smoking status: Never   Smokeless tobacco: Never  Substance Use Topics   Alcohol use: Yes    Comment: OCC    Allergies  Allergen Reactions   Bacitracin Rash    Current Outpatient Medications  Medication Sig Dispense Refill   albuterol (VENTOLIN HFA) 108 (90 Base) MCG/ACT inhaler Inhale 2 puffs into the lungs every 4 (four) hours as needed for wheezing or shortness of breath. 1 each 0   estradiol (ESTRACE) 1 MG tablet Take 1 tablet (1 mg total) by mouth daily for 10 days. 10 tablet 0   hydrOXYzine (ATARAX/VISTARIL) 25 MG tablet Take 1 tablet (25 mg total) by mouth 2 (two) times daily as needed for anxiety. May take 1 additional pill per dose if acute panic attack. Max 3 pills in 24 hours 30 tablet 2   mirtazapine (REMERON) 7.5 MG tablet TAKE 1 TABLET(7.5 MG) BY MOUTH AT BEDTIME 90 tablet 1   levonorgestrel (MIRENA) 20 MCG/24HR IUD 1 each by Intrauterine route once.     oxyCODONE (ROXICODONE) 5 MG immediate release tablet Take 1 tablet (5 mg total) by mouth every 6 (six) hours as needed for severe pain. (Patient  not taking: Reported on 07/12/2021) 15 tablet 0   No current facility-administered medications for this visit.    Review of Systems  Constitutional: Negative for chills, fatigue, fever and unexpected weight change.  HENT: Negative for trouble swallowing.  Eyes: Negative for loss of vision.  Respiratory: Negative for cough, shortness of breath and wheezing.  Cardiovascular: Negative for chest pain, leg swelling, palpitations and syncope.  GI: Negative for abdominal pain, blood in stool, diarrhea, nausea and vomiting.  GU: Negative for difficulty urinating, dysuria, frequency and hematuria.  Musculoskeletal: Negative for back pain, leg pain and joint pain.  Skin: Negative for rash.  Neurological: Negative for dizziness, headaches, light-headedness, numbness and seizures.  Psychiatric:  Negative for behavioral problem, confusion, depressed mood and sleep disturbance.       Objective:  Objective   Vitals:   07/12/21 1328  BP: 130/72  Weight: 174 lb 9.6 oz (79.2 kg)  Height: 5\' 2"  (1.575 m)   Body mass index is 31.93 kg/m.  Physical Exam Vitals and nursing note reviewed. Exam conducted with a chaperone present.  Constitutional:      Appearance: Normal appearance.  HENT:     Head: Normocephalic and atraumatic.  Eyes:     Extraocular Movements: Extraocular movements intact.     Pupils: Pupils are equal, round, and reactive to light.  Cardiovascular:     Rate and Rhythm: Normal rate and regular rhythm.  Pulmonary:     Effort: Pulmonary effort is normal.     Breath sounds: Normal breath sounds.  Abdominal:     General: Abdomen is flat.     Palpations: Abdomen is soft.  Musculoskeletal:     Cervical back: Normal range of motion.  Skin:    General: Skin is warm and dry.  Neurological:     General: No focal deficit present.     Mental Status: She is alert and oriented to person, place, and time.  Psychiatric:        Behavior: Behavior normal.        Thought Content: Thought content normal.        Judgment: Judgment normal.    Assessment/Plan:     40 yo with irregular break through bleeding with I=IUD Rx to trial estradiol rather than IUD exchange. IUD for 6 years per patient 2. Mood symptoms and vasomotor symptoms. Will trial buspar and paxil.  More than 10 minutes were spent face to face with the patient in the room, reviewing the medical record, labs and images, and coordinating care for the patient. The plan of management was discussed in detail and counseling was provided.    41 MD [facility], [facility] 07/12/2021 2:05 PM

## 2021-07-12 NOTE — Patient Instructions (Signed)
Managing Anxiety, Adult ?After being diagnosed with anxiety, you may be relieved to know why you have felt or behaved a certain way. You may also feel overwhelmed about the treatment ahead and what it will mean for your life. With care and support, you can manage this condition. ?How to manage lifestyle changes ?Managing stress and anxiety ?Stress is your body's reaction to life changes and events, both good and bad. Most stress will last just a few hours, but stress can be ongoing and can lead to more than just stress. Although stress can play a major role in anxiety, it is not the same as anxiety. Stress is usually caused by something external, such as a deadline, test, or competition. Stress normally passes after the triggering event has ended.  ?Anxiety is caused by something internal, such as imagining a terrible outcome or worrying that something will go wrong that will devastate you. Anxiety often does not go away even after the triggering event is over, and it can become long-term (chronic) worry. It is important to understand the differences between stress and anxiety and to manage your stress effectively so that it does not lead to an anxious response. ?Talk with your health care provider or a counselor to learn more about reducing anxiety and stress. He or she may suggest tension reduction techniques, such as: ?Music therapy. Spend time creating or listening to music that you enjoy and that inspires you. ?Mindfulness-based meditation. Practice being aware of your normal breaths while not trying to control your breathing. It can be done while sitting or walking. ?Centering prayer. This involves focusing on a word, phrase, or sacred image that means something to you and brings you peace. ?Deep breathing. To do this, expand your stomach and inhale slowly through your nose. Hold your breath for 3-5 seconds. Then exhale slowly, letting your stomach muscles relax. ?Self-talk. Learn to notice and identify  thought patterns that lead to anxiety reactions and change those patterns to thoughts that feel peaceful. ?Muscle relaxation. Taking time to tense muscles and then relax them. ?Choose a tension reduction technique that fits your lifestyle and personality. These techniques take time and practice. Set aside 5-15 minutes a day to do them. Therapists can offer counseling and training in these techniques. The training to help with anxiety may be covered by some insurance plans. ?Other things you can do to manage stress and anxiety include: ?Keeping a stress diary. This can help you learn what triggers your reaction and then learn ways to manage your response. ?Thinking about how you react to certain situations. You may not be able to control everything, but you can control your response. ?Making time for activities that help you relax and not feeling guilty about spending your time in this way. ?Doing visual imagery. This involves imagining or creating mental pictures to help you relax. ?Practicing yoga. Through yoga poses, you can lower tension and promote relaxation. ? ?Medicines ?Medicines can help ease symptoms. Medicines for anxiety include: ?Antidepressant medicines. These are usually prescribed for long-term daily control. ?Anti-anxiety medicines. These may be added in severe cases, especially when panic attacks occur. ?Medicines will be prescribed by a health care provider. When used together, medicines, psychotherapy, and tension reduction techniques may be the most effective treatment. ?Relationships ?Relationships can play a big part in helping you recover. Try to spend more time connecting with trusted friends and family members. ?Consider going to couples counseling if you have a partner, taking family education classes, or going to family   therapy. ?Therapy can help you and others better understand your condition. ?How to recognize changes in your anxiety ?Everyone responds differently to treatment for  anxiety. Recovery from anxiety happens when symptoms decrease and stop interfering with your daily activities at home or work. This may mean that you will start to: ?Have better concentration and focus. Worry will interfere less in your daily thinking. ?Sleep better. ?Be less irritable. ?Have more energy. ?Have improved memory. ?It is also important to recognize when your condition is getting worse. Contact your health care provider if your symptoms interfere with home or work and you feel like your condition is not improving. ?Follow these instructions at home: ?Activity ?Exercise. Adults should do the following: ?Exercise for at least 150 minutes each week. The exercise should increase your heart rate and make you sweat (moderate-intensity exercise). ?Strengthening exercises at least twice a week. ?Get the right amount and quality of sleep. Most adults need 7-9 hours of sleep each night. ?Lifestyle ? ?Eat a healthy diet that includes plenty of vegetables, fruits, whole grains, low-fat dairy products, and lean protein. ?Do not eat a lot of foods that are high in fats, added sugars, or salt (sodium). ?Make choices that simplify your life. ?Do not use any products that contain nicotine or tobacco. These products include cigarettes, chewing tobacco, and vaping devices, such as e-cigarettes. If you need help quitting, ask your health care provider. ?Avoid caffeine, alcohol, and certain over-the-counter cold medicines. These may make you feel worse. Ask your pharmacist which medicines to avoid. ?General instructions ?Take over-the-counter and prescription medicines only as told by your health care provider. ?Keep all follow-up visits. This is important. ?Where to find support ?You can get help and support from these sources: ?Self-help groups. ?Online and community organizations. ?A trusted spiritual leader. ?Couples counseling. ?Family education classes. ?Family therapy. ?Where to find more information ?You may find  that joining a support group helps you deal with your anxiety. The following sources can help you locate counselors or support groups near you: ?Mental Health America: www.mentalhealthamerica.net ?Anxiety and Depression Association of America (ADAA): www.adaa.org ?National Alliance on Mental Illness (NAMI): www.nami.org ?Contact a health care provider if: ?You have a hard time staying focused or finishing daily tasks. ?You spend many hours a day feeling worried about everyday life. ?You become exhausted by worry. ?You start to have headaches or frequently feel tense. ?You develop chronic nausea or diarrhea. ?Get help right away if: ?You have a racing heart and shortness of breath. ?You have thoughts of hurting yourself or others. ?If you ever feel like you may hurt yourself or others, or have thoughts about taking your own life, get help right away. Go to your nearest emergency department or: ?Call your local emergency services (911 in the [place] ?Call a suicide crisis helpline, such as the National Suicide Prevention Lifeline at 1-800-273-8255 or 988 in the [place] This is open 24 hours a day in the [place] ?Text the Crisis Text Line at 741741 (in the [place] ?Summary ?Taking steps to learn and use tension reduction techniques can help calm you and help prevent triggering an anxiety reaction. ?When used together, medicines, psychotherapy, and tension reduction techniques may be the most effective treatment. ?Family, friends, and partners can play a big part in supporting you. ?This information is not intended to replace advice given to you by your health care provider. Make sure you discuss any questions you have with your health care provider. ?Document Revised: 02/20/2021 Document Reviewed: 11/18/2020 ?Elsevier Patient   Education ? 2022 [employer] Inc. ? ?

## 2021-07-18 ENCOUNTER — Other Ambulatory Visit: Payer: Self-pay | Admitting: Obstetrics and Gynecology

## 2021-07-18 DIAGNOSIS — B9689 Other specified bacterial agents as the cause of diseases classified elsewhere: Secondary | ICD-10-CM

## 2021-07-18 LAB — NUSWAB VAGINITIS PLUS (VG+)
Atopobium vaginae: HIGH Score — AB
Candida albicans, NAA: NEGATIVE
Candida glabrata, NAA: NEGATIVE
Chlamydia trachomatis, NAA: NEGATIVE
Neisseria gonorrhoeae, NAA: NEGATIVE
Trich vag by NAA: NEGATIVE

## 2021-07-18 MED ORDER — METRONIDAZOLE 500 MG PO TABS: 500.0000 mg | ORAL_TABLET | Freq: Two times a day (BID) | ORAL | 0 refills | Status: DC

## 2021-07-18 NOTE — Telephone Encounter (Signed)
Rx sent 

## 2021-08-08 ENCOUNTER — Ambulatory Visit: Payer: BC Managed Care – PPO

## 2021-08-09 ENCOUNTER — Ambulatory Visit: Payer: BC Managed Care – PPO | Admitting: Obstetrics and Gynecology

## 2021-12-30 ENCOUNTER — Other Ambulatory Visit: Payer: Self-pay

## 2021-12-30 ENCOUNTER — Emergency Department: Payer: BC Managed Care – PPO

## 2021-12-30 ENCOUNTER — Other Ambulatory Visit: Payer: BC Managed Care – PPO

## 2021-12-30 ENCOUNTER — Emergency Department
Admission: EM | Admit: 2021-12-30 | Discharge: 2021-12-30 | Disposition: A | Discharge status: Home / Self Care | Payer: BC Managed Care – PPO | Attending: Emergency Medicine | Admitting: Emergency Medicine

## 2021-12-30 DIAGNOSIS — M25461 Effusion, right knee: Secondary | ICD-10-CM | POA: Diagnosis not present

## 2021-12-30 DIAGNOSIS — M25569 Pain in unspecified knee: Secondary | ICD-10-CM | POA: Diagnosis not present

## 2021-12-30 DIAGNOSIS — M25561 Pain in right knee: Secondary | ICD-10-CM | POA: Insufficient documentation

## 2021-12-30 DIAGNOSIS — R600 Localized edema: Secondary | ICD-10-CM | POA: Diagnosis not present

## 2021-12-30 MED ORDER — MELOXICAM 15 MG PO TABS: 15.0000 mg | ORAL_TABLET | Freq: Every day | ORAL | 2 refills | Status: AC

## 2021-12-30 MED ORDER — TRAMADOL HCL 50 MG PO TABS
50.0000 mg | ORAL_TABLET | Freq: Once | ORAL | Status: AC
  Administered 2021-12-30: 50 mg via ORAL
  Filled 2021-12-30: qty 1

## 2021-12-30 NOTE — ED Provider Notes (Signed)
[facility] Provider Note    Event Date/Time   First MD Initiated Contact with Patient 12/30/21 [phone]     (approximate)   History   Knee Pain   HPI  Tammy Bennett is a 41 y.o. female with history of SVT, anxiety, gastritis, tendinitis and anemia presents emergency department for right knee pain.  No known injury.  States that she woke up with sharp pain that is shooting and coming in waves.  States had some knee pain yesterday but today has worsened.  Some swelling.  No numbness or tingling.  Patient is has a IUD.  She is a non-smoker.      Physical Exam   Triage Vital Signs: ED Triage Vitals  Enc Vitals Group     BP 12/30/21 0809 (!) 138/97     Pulse Rate 12/30/21 0809 (!) 103     Resp 12/30/21 0809 18     Temp 12/30/21 0809 98.7 F (37.1 C)     Temp Source 12/30/21 0809 Oral     SpO2 12/30/21 0809 100 %     Weight 12/30/21 0829 174 lb 9.7 oz (79.2 kg)     Height 12/30/21 0829 5\' 2"  (1.575 m)     Head Circumference --      Peak Flow --      Pain Score 12/30/21 0809 10     Pain Loc --      Pain Edu? --      Excl. in GC? --     Most recent vital signs: Vitals:   12/30/21 0809  BP: (!) 138/97  Pulse: (!) 103  Resp: 18  Temp: 98.7 F (37.1 C)  SpO2: 100%     General: Awake, no distress.   CV:  Good peripheral perfusion. regular rate and  rhythm Resp:  Normal effort.  Abd:  No distention.   Other:  Right knee is tender posteriorly, neurovascular is intact, pain is reproduced with movement   ED Results / Procedures / Treatments   Labs (all labs ordered are listed, but only abnormal results are displayed) Labs Reviewed - No data to display   EKG     RADIOLOGY X-ray of the right knee    PROCEDURES:   Procedures   MEDICATIONS ORDERED IN ED: Medications  traMADol (ULTRAM) tablet 50 mg (50 mg Oral Given 12/30/21 0844)     IMPRESSION / MDM / ASSESSMENT AND PLAN / ED COURSE  I reviewed the triage vital signs  and the nursing notes.                              Differential diagnosis includes, but is not limited to, effusion, internal derangement, arthritis, DVT  X-ray of the right knee ordered, patient given tramadol for pain  X-ray of the right knee was interpreted by me as having a small effusion under the patella, confirmed by radiology  I did explain these findings to the patient.  She had a little relief with tramadol.  Explained to her that with the fluid under the patella it will cause increased pain with walking.  We will place her in a knee immobilizer and give her crutches.  She is to use ice.  She was given a prescription for meloxicam.  Take over-the-counter Tylenol if worsening pain.  Follow-up with orthopedics.  Patient is in agreement with treatment plan.  She was discharged stable condition in the care of her  husband.       FINAL CLINICAL IMPRESSION(S) / ED DIAGNOSES   Final diagnoses:  Acute pain of right knee  Prepatellar effusion of right knee     Rx / DC Orders   ED Discharge Orders          Ordered    meloxicam (MOBIC) 15 MG tablet  Daily        12/30/21 0941             Note:  This document was prepared using Dragon voice recognition software and may include unintentional dictation errors.    Faythe Ghee, PA-C 12/30/21 1038    Chesley Noon, MD 12/30/21 1540

## 2021-12-30 NOTE — ED Notes (Signed)
See triage note  presents with right knee pain which started yesterday  denies any injury  states pain started after walking yesterday  no swelling noted

## 2021-12-30 NOTE — ED Triage Notes (Signed)
Pt comes with c/o right knee pain that started yesterday. Pt denies any known injuries. Pt states some swelling and spasms.

## 2021-12-30 NOTE — Discharge Instructions (Signed)
Follow-up with orthopedics.  Return emergency department worsening.  Take medication as prescribed.  Apply ice to the knee.

## 2022-01-13 ENCOUNTER — Ambulatory Visit: Payer: BC Managed Care – PPO | Admitting: Internal Medicine

## 2022-02-19 ENCOUNTER — Encounter: Payer: Self-pay | Admitting: Physician Assistant

## 2022-02-19 ENCOUNTER — Other Ambulatory Visit: Payer: Self-pay | Admitting: Physician Assistant

## 2022-02-19 ENCOUNTER — Ambulatory Visit: Payer: BC Managed Care – PPO | Admitting: Physician Assistant

## 2022-02-19 DIAGNOSIS — N6325 Unspecified lump in the left breast, overlapping quadrants: Secondary | ICD-10-CM

## 2022-02-19 NOTE — Progress Notes (Addendum)
Acute Office Visit   Patient: Tammy Bennett   DOB: May 16, 1981   41 y.o. Female  MRN: [id number] Visit Date: 02/19/2022  Today's healthcare provider: Oswaldo Conroy Jasin Brazel, PA-C  Introduced myself to the patient as a Secondary school teacher and provided education on APPs in clinical practice.    Chief Complaint  Patient presents with   Breast Mass    Left breast    Subjective    HPI HPI     Breast Mass    Additional comments: Left breast       Last edited by Paschal Dopp, CMA on 02/19/2022 10:59 AM.      States she first noticed this last night Reports she felt a "ball" like lump in her breast last night States she has had multiple surgeries to remove cysts from both breasts She reports some skin changes along the left nipple area  Denies redness, drainage, swelling,  States she is still having regular menses and LMP was 02/07/2022 -02/15/2022    Medications: Outpatient Medications Prior to Visit  Medication Sig   albuterol (VENTOLIN HFA) 108 (90 Base) MCG/ACT inhaler Inhale 2 puffs into the lungs every 4 (four) hours as needed for wheezing or shortness of breath.   busPIRone (BUSPAR) 5 MG tablet Take 1 tablet (5 mg total) by mouth 3 (three) times daily as needed (anxiety).   hydrOXYzine (ATARAX/VISTARIL) 25 MG tablet Take 1 tablet (25 mg total) by mouth 2 (two) times daily as needed for anxiety. May take 1 additional pill per dose if acute panic attack. Max 3 pills in 24 hours   levonorgestrel (MIRENA) 20 MCG/24HR IUD 1 each by Intrauterine route once.   meloxicam (MOBIC) 15 MG tablet Take 1 tablet (15 mg total) by mouth daily.   mirtazapine (REMERON) 7.5 MG tablet TAKE 1 TABLET(7.5 MG) BY MOUTH AT BEDTIME   PARoxetine (PAXIL) 10 MG tablet Take 1 tablet (10 mg total) by mouth daily.   estradiol (ESTRACE) 1 MG tablet Take 1 tablet (1 mg total) by mouth daily for 10 days.   [DISCONTINUED] metroNIDAZOLE (FLAGYL) 500 MG tablet Take 1 tablet (500 mg total) by mouth 2 (two) times daily.    [DISCONTINUED] oxyCODONE (ROXICODONE) 5 MG immediate release tablet Take 1 tablet (5 mg total) by mouth every 6 (six) hours as needed for severe pain.   No facility-administered medications prior to visit.    Review of Systems  Constitutional:  Negative for chills, diaphoresis, fever and unexpected weight change.  Cardiovascular:  Negative for chest pain.  Genitourinary:  Negative for menstrual problem.  Skin:        Painful breast lump and pain in axilla.       Objective    BP 112/76 (BP Location: Left Arm, Patient Position: Sitting, Cuff Size: Normal)   Pulse 91   Temp (!) 97.1 F (36.2 C) (Temporal)   Wt 181 lb (82.1 kg)   SpO2 99%   BMI 33.11 kg/m    Physical Exam Vitals reviewed. Exam conducted with a chaperone present.  Constitutional:      Appearance: Normal appearance. She is obese.  HENT:     Head: Normocephalic and atraumatic.  Pulmonary:     Effort: Pulmonary effort is normal.  Chest:  Breasts:    Tanner Score is 5.     Breasts are symmetrical.     Right: Normal. No swelling, bleeding, inverted nipple, mass, nipple discharge, skin change or tenderness.  Left: Mass and tenderness present. No swelling, bleeding, inverted nipple, nipple discharge or skin change.    Lymphadenopathy:     Upper Body:     Right upper body: No supraclavicular, axillary or pectoral adenopathy.     Left upper body: No supraclavicular, axillary or pectoral adenopathy.  Skin:    General: Skin is warm.     Findings: No bruising, erythema or rash.  Neurological:     General: No focal deficit present.     Mental Status: She is alert and oriented to person, place, and time.  Psychiatric:        Mood and Affect: Mood normal.        Behavior: Behavior normal.        Thought Content: Thought content normal.        Judgment: Judgment normal.       No results found for any visits on 02/19/22.  Assessment & Plan      No follow-ups on file.     Problem List Items Addressed  This Visit   None Visit Diagnoses     Mass overlapping multiple quadrants of left breast      Acute, new problem States she first noticed last night States mass is tender to palpation Mass is approx 5 cm in size, nodular, smooth and mobile during exam and does not appear to extend to axilla  Suspect cyst given exam findings and proximity to LMP but will order [place] to rule out other etiologies Recommend [place] of area to discern character  Recommend using warm compresses, tylenol and ibuprofen as needed for pain management Follow up as needed   Relevant Orders   US BREAST COMPLETE UNI LEFT INC AXILLA        No follow-ups on file.   I, Kailey Esquilin E Tatelyn Vanhecke, PA-C, have reviewed all documentation for this visit. The documentation on 02/19/22 for the exam, diagnosis, procedures, and orders are all accurate and complete.   Jacquelin Hawking, MHS, PA-C [facility]

## 2022-02-19 NOTE — Patient Instructions (Addendum)
After the results of the exam I recommend that we schedule an ultrasound of the breast tissue to help diagnose this   If you notice the following please let us know: fever, changes to the skin, nipple discharge, warmth or redness to the skin, drainage  You can use warm compresses, Tylenol and Ibuprofen to help with the pain  Try not to manipulate the area and use a comfortable bra as tolerated.

## 2022-09-04 ENCOUNTER — Telehealth: Payer: Self-pay | Admitting: *Deleted

## 2022-09-04 NOTE — Patient Outreach (Signed)
  Care Coordination   09/04/2022 Name: Tammy Bennett MRN: [id number] DOB: 22-Feb-1981   Care Coordination Outreach Attempts:  An unsuccessful telephone outreach was attempted today to offer the patient information about available care coordination services as a benefit of their health plan.   Follow Up Plan:  Additional outreach attempts will be made to offer the patient care coordination information and services.   Encounter Outcome:  No Answer   Care Coordination Interventions:  No, not indicated    Valente David, RN, MSN, [facility] Care Management Care Management Coordinator [phone]

## 2022-09-09 ENCOUNTER — Telehealth: Payer: Self-pay | Admitting: *Deleted

## 2022-09-09 NOTE — Patient Outreach (Signed)
  Care Coordination   09/09/2022 Name: Tammy Bennett MRN: [id number] DOB: 02/05/1981   Care Coordination Outreach Attempts:  A second unsuccessful outreach was attempted today to offer the patient with information about available care coordination services as a benefit of their health plan.     Follow Up Plan:  Additional outreach attempts will be made to offer the patient care coordination information and services.   Encounter Outcome:  No Answer   Care Coordination Interventions:  No, not indicated    Valente David, RN, MSN, [facility] Care Management Care Management Coordinator [phone]

## 2022-09-12 ENCOUNTER — Telehealth: Payer: Self-pay | Admitting: *Deleted

## 2022-09-12 NOTE — Patient Outreach (Signed)
  Care Coordination   09/12/2022 Name: Tammy Bennett MRN: [id number] DOB: Oct 02, 1980   Care Coordination Outreach Attempts:  A third unsuccessful outreach was attempted today to offer the patient with information about available care coordination services as a benefit of their health plan.   Follow Up Plan:  No further outreach attempts will be made at this time. We have been unable to contact the patient to offer or enroll patient in care coordination services  Encounter Outcome:  No Answer   Care Coordination Interventions:  No, not indicated    Valente David, RN, MSN, [facility] Care Management Care Management Coordinator [phone]

## 2023-01-15 ENCOUNTER — Ambulatory Visit: Payer: BC Managed Care – PPO | Admitting: Certified Nurse Midwife

## 2023-02-16 ENCOUNTER — Ambulatory Visit: Payer: BC Managed Care – PPO | Admitting: Licensed Practical Nurse

## 2023-04-23 ENCOUNTER — Ambulatory Visit (INDEPENDENT_AMBULATORY_CARE_PROVIDER_SITE_OTHER): Payer: BC Managed Care – PPO | Admitting: Internal Medicine

## 2023-04-23 VITALS — BP 124/74 | HR 99 | Temp 96.9°F | Ht 62.0 in | Wt 184.0 lb

## 2023-04-23 DIAGNOSIS — Z136 Encounter for screening for cardiovascular disorders: Secondary | ICD-10-CM | POA: Diagnosis not present

## 2023-04-23 DIAGNOSIS — R7309 Other abnormal glucose: Secondary | ICD-10-CM

## 2023-04-23 DIAGNOSIS — Z0001 Encounter for general adult medical examination with abnormal findings: Secondary | ICD-10-CM

## 2023-04-23 DIAGNOSIS — F419 Anxiety disorder, unspecified: Secondary | ICD-10-CM

## 2023-04-23 DIAGNOSIS — Z1231 Encounter for screening mammogram for malignant neoplasm of breast: Secondary | ICD-10-CM | POA: Diagnosis not present

## 2023-04-23 DIAGNOSIS — F5104 Psychophysiologic insomnia: Secondary | ICD-10-CM

## 2023-04-23 DIAGNOSIS — F410 Panic disorder [episodic paroxysmal anxiety] without agoraphobia: Secondary | ICD-10-CM

## 2023-04-23 MED ORDER — MIRTAZAPINE 7.5 MG PO TABS: ORAL_TABLET | ORAL | 3 refills | Status: AC

## 2023-04-23 MED ORDER — HYDROXYZINE HCL 25 MG PO TABS: 25.0000 mg | ORAL_TABLET | Freq: Two times a day (BID) | ORAL | 2 refills | Status: AC | PRN

## 2023-04-23 MED ORDER — BUSPIRONE HCL 5 MG PO TABS: 5.0000 mg | ORAL_TABLET | Freq: Three times a day (TID) | ORAL | 3 refills | Status: AC | PRN

## 2023-04-23 MED ORDER — PAROXETINE HCL 10 MG PO TABS: 10.0000 mg | ORAL_TABLET | Freq: Every day | ORAL | 3 refills | Status: AC

## 2023-04-23 NOTE — Progress Notes (Signed)
Subjective:    Patient ID: Tammy Bennett, female    DOB: 05-17-1981, 42 y.o.   MRN: [id number]  HPI  Discussed the use of AI scribe software for clinical note transcription with the patient, who gave verbal consent to proceed.  History of Present Illness   The patient presents to the clinic today for her annual exam. She also reports concerns about swelling in her feet and ankles. She reports that the swelling is sometimes present upon waking, but typically worsens by midday or the end of the day. She also notes that prolonged periods of standing can exacerbate the swelling and cause discomfort in her feet. She has attempted to manage the swelling by elevating her feet above her heart when possible.  The patient has been making efforts to improve her diet, reducing her intake of red meat and fried foods, and increasing her consumption of lean meats and vegetables. She reports drinking mostly water throughout the day, but admits to not feeling particularly thirsty. She has also been participating in co-ed softball twice a week for exercise.  She does not typically take flu shots. She thinks she has had a tetanus vaccination less than 10 years ago. She has had 2 covid vaccines. Her last pap smear was 12/2018. she has not had a mammogram in the last year.       Review of Systems     Past Medical History:  Diagnosis Date   Allergy    Anemia    DURING PREGNANCY   Anxiety    Depression    Gastritis    GERD (gastroesophageal reflux disease)    IBS (irritable bowel syndrome)    Murmur    as a child    SVT (supraventricular tachycardia)    Tendonitis    ARM RIGHT    Current Outpatient Medications  Medication Sig Dispense Refill   albuterol (VENTOLIN HFA) 108 (90 Base) MCG/ACT inhaler Inhale 2 puffs into the lungs every 4 (four) hours as needed for wheezing or shortness of breath. 1 each 0   busPIRone (BUSPAR) 5 MG tablet Take 1 tablet (5 mg total) by mouth 3 (three) times daily as  needed (anxiety). 90 tablet 3   estradiol (ESTRACE) 1 MG tablet Take 1 tablet (1 mg total) by mouth daily for 10 days. 10 tablet 0   hydrOXYzine (ATARAX/VISTARIL) 25 MG tablet Take 1 tablet (25 mg total) by mouth 2 (two) times daily as needed for anxiety. May take 1 additional pill per dose if acute panic attack. Max 3 pills in 24 hours 30 tablet 2   levonorgestrel (MIRENA) 20 MCG/24HR IUD 1 each by Intrauterine route once.     mirtazapine (REMERON) 7.5 MG tablet TAKE 1 TABLET(7.5 MG) BY MOUTH AT BEDTIME 90 tablet 1   PARoxetine (PAXIL) 10 MG tablet Take 1 tablet (10 mg total) by mouth daily. 30 tablet 2   No current facility-administered medications for this visit.    Allergies  Allergen Reactions   Bacitracin Rash    Family History  Problem Relation Age of Onset   Stroke Mother    Stomach cancer Maternal Grandmother    Leukemia Maternal Grandfather    Pancreatic cancer Paternal Grandmother    Healthy Brother    Sickle cell anemia Half-Sister    Crohn's disease Half-Sister    Ulcerative colitis Half-Sister    Thyroid disease Cousin    Breast cancer Maternal Aunt 1    Social History   Socioeconomic History   Marital  status: Married    Spouse name: Not on file   Number of children: Not on file   Years of education: Not on file   Highest education level: 12th grade  Occupational History   Not on file  Tobacco Use   Smoking status: Never   Smokeless tobacco: Never  Vaping Use   Vaping status: Never Used  Substance and Sexual Activity   Alcohol use: Yes    Comment: OCC   Drug use: Not Currently    Types: Marijuana    Comment: none in years   Sexual activity: Yes    Birth control/protection: Surgical, I.U.D.    Comment: Tubal/ IUD  Other Topics Concern   Not on file  Social History Narrative   ** Merged History Encounter **       Social Determinants of Health   Financial Resource Strain: High Risk (04/23/2023)   Overall Financial Resource Strain (CARDIA)     Difficulty of Paying Living Expenses: Hard  Food Insecurity: Food Insecurity Present (04/23/2023)   Hunger Vital Sign    Worried About Running Out of Food in the Last Year: Often true    Ran Out of Food in the Last Year: Sometimes true  Transportation Needs: Unmet Transportation Needs (04/23/2023)   PRAPARE - Administrator, Civil Service (Medical): Yes    Lack of Transportation (Non-Medical): No  Physical Activity: Insufficiently Active (04/23/2023)   Exercise Vital Sign    Days of Exercise per Week: 1 day    Minutes of Exercise per Session: 60 min  Stress: Stress Concern Present (04/23/2023)   [employer] of Occupational Health - Occupational Stress Questionnaire    Feeling of Stress : To some extent  Social Connections: Moderately Integrated (04/23/2023)   Social Connection and Isolation Panel [NHANES]    Frequency of Communication with Friends and Family: More than three times a week    Frequency of Social Gatherings with Friends and Family: Never    Attends Religious Services: More than 4 times per year    Active Member of [employer] or Organizations: No    Attends Engineer, structural: Not on file    Marital Status: Married  Catering manager Violence: Not on file     Constitutional: Denies fever, malaise, fatigue, headache or abrupt weight changes.  HEENT: Denies eye pain, eye redness, ear pain, ringing in the ears, wax buildup, runny nose, nasal congestion, bloody nose, or sore throat. Respiratory: Denies difficulty breathing, shortness of breath, cough or sputum production.   Cardiovascular: Patient reports swelling in legs.  Denies chest pain, chest tightness, palpitations or swelling in the hands.  Gastrointestinal: Pt reports intermittent dysphagia, reflux. Denies abdominal pain, bloating, constipation, diarrhea or blood in the stool.  GU: Denies urgency, frequency, pain with urination, burning sensation, blood in urine, odor or discharge. Musculoskeletal:  Denies decrease in range of motion, difficulty with gait, muscle pain or joint pain and swelling.  Skin: Denies redness, rashes, lesions or ulcercations.  Neurological: Pt reports insomnia. Denies dizziness, difficulty with memory, difficulty with speech or problems with balance and coordination.  Psych: Pt has a history of anxiety. Denies depression, SI/HI.  No other specific complaints in a complete review of systems (except as listed in HPI above).  Objective:   Physical Exam   BP 124/74 (BP Location: Left Arm, Patient Position: Sitting, Cuff Size: Normal)   Pulse 99   Temp (!) 96.9 F (36.1 C) (Temporal)   Ht 5\' 2"  (1.575 m)  Wt 184 lb (83.5 kg)   SpO2 100%   BMI 33.65 kg/m   Wt Readings from Last 3 Encounters:  02/19/22 181 lb (82.1 kg)  12/30/21 174 lb 9.7 oz (79.2 kg)  07/12/21 174 lb 9.6 oz (79.2 kg)    General: Appears her stated age, obese, in NAD. Skin: Warm, dry and intact.  HEENT: Head: normal shape and size; Eyes: sclera white, no icterus, conjunctiva pink, PERRLA and EOMs intact;  Neck:  Neck supple, trachea midline. No masses, lumps or thyromegaly present.  Cardiovascular: Normal rate and rhythm. S1,S2 noted.  No murmur, rubs or gallops noted. No JVD.  Trace nonpitting BLE edema.  Pulmonary/Chest: Normal effort and positive vesicular breath sounds. No respiratory distress. No wheezes, rales or ronchi noted.  Abdomen: Soft and nontender. Normal bowel sounds.  Musculoskeletal: Strength 5/5 BUE/BLE. No difficulty with gait.  Neurological: Alert and oriented. Cranial nerves II-XII grossly intact. Coordination normal.  Psychiatric: Mood and affect normal. Behavior is normal. Judgment and thought content normal.   BMET    Component Value Date/Time   NA 140 08/17/2020 1139   K 3.9 08/17/2020 1139   CL 109 08/17/2020 1139   CO2 26 08/17/2020 1139   GLUCOSE 81 08/17/2020 1139   BUN 13 08/17/2020 1139   CREATININE 0.66 08/17/2020 1139   CALCIUM 9.5 08/17/2020  1139   GFRNONAA 111 08/17/2020 1139   GFRAA 129 08/17/2020 1139    Lipid Panel  No results found for: "CHOL", "TRIG", "HDL", "CHOLHDL", "VLDL", "LDLCALC"  CBC    Component Value Date/Time   WBC 8.0 01/31/2021 1417   WBC 7.6 08/17/2020 1139   RBC 4.20 01/31/2021 1417   RBC 4.29 11/16/2020 0000   HGB 12.7 01/31/2021 1417   HCT 37.6 01/31/2021 1417   PLT 351 01/31/2021 1417   MCV 90 01/31/2021 1417   MCH 30.2 01/31/2021 1417   MCH 29.9 08/17/2020 1139   MCHC 33.8 01/31/2021 1417   MCHC 33.2 08/17/2020 1139   RDW 12.0 01/31/2021 1417   LYMPHSABS 1,695 08/17/2020 1139   MONOABS 0.4 03/29/2019 0857   EOSABS 23 08/17/2020 1139   BASOSABS 30 08/17/2020 1139    Hgb A1C No results found for: "HGBA1C"         Assessment & Plan:   Assessment and Plan    Lower Extremity Edema Daily swelling in feet and ankles, worse at the end of the day. No associated shortness of breath. Possible fluid retention due to sedentary job and high salt intake. -Start Hydrochlorothiazide 12.5mg  daily or as needed, pending kidney function results from blood work. -Monitor salt intake, aim for less than 2000mg  per day. -Consider use of compression socks and elevation of legs above heart level when possible.  General Health Maintenance: -Declined flu shot. -Tetanus vaccine up to date. -COVID vaccine completed, booster available in the fall. -Pap smear up to date, due again in 2025. -Mammogram referral given, patient to schedule. -Advise annual eye exam and dental visit. -Continue balanced diet and exercise regimen. -Order blood work CBC, CMET, lipid, A1c -Plan for annual follow-up, or sooner if abnormal lab results.        RTC in 1 year for your annual exam Nicki Reaper, NP

## 2023-04-23 NOTE — Patient Instructions (Signed)

## 2023-04-23 NOTE — Assessment & Plan Note (Signed)
Deteriorated since she is no longer taking her mirtazapine, refilled today

## 2023-04-23 NOTE — Assessment & Plan Note (Signed)
Deteriorated since her husband got laid off of his job yesterday Will restart paroxetine, buspirone and hydroxyzine Support offered

## 2023-04-24 LAB — CBC
HCT: 37.5 % (ref 35.0–45.0)
Hemoglobin: 12.3 g/dL (ref 11.7–15.5)
MCH: 29.8 pg (ref 27.0–33.0)
MCHC: 32.8 g/dL (ref 32.0–36.0)
MCV: 90.8 fL (ref 80.0–100.0)
MPV: 9.2 fL (ref 7.5–12.5)
Platelets: 309 10*3/uL (ref 140–400)
RBC: 4.13 10*6/uL (ref 3.80–5.10)
RDW: 12.5 % (ref 11.0–15.0)
WBC: 8.4 10*3/uL (ref 3.8–10.8)

## 2023-04-24 LAB — COMPLETE METABOLIC PANEL WITH GFR
AG Ratio: 1.6 (calc) (ref 1.0–2.5)
ALT: 13 U/L (ref 6–29)
AST: 15 U/L (ref 10–30)
Albumin: 4.1 g/dL (ref 3.6–5.1)
Alkaline phosphatase (APISO): 53 U/L (ref 31–125)
BUN: 14 mg/dL (ref 7–25)
CO2: 25 mmol/L (ref 20–32)
Calcium: 9.4 mg/dL (ref 8.6–10.2)
Chloride: 107 mmol/L (ref 98–110)
Creat: 0.78 mg/dL (ref 0.50–0.99)
Globulin: 2.6 g/dL (ref 1.9–3.7)
Glucose, Bld: 79 mg/dL (ref 65–99)
Potassium: 4.7 mmol/L (ref 3.5–5.3)
Sodium: 138 mmol/L (ref 135–146)
Total Bilirubin: 0.3 mg/dL (ref 0.2–1.2)
Total Protein: 6.7 g/dL (ref 6.1–8.1)
eGFR: 97 mL/min/{1.73_m2} (ref >=60)

## 2023-04-24 LAB — LIPID PANEL
Cholesterol: 149 mg/dL (ref <200)
HDL: 59 mg/dL (ref >=50)
LDL Cholesterol (Calc): 81 mg/dL
Non-HDL Cholesterol (Calc): 90 mg/dL (ref <130)
Total CHOL/HDL Ratio: 2.5 (calc) (ref <5.0)
Triglycerides: 33 mg/dL (ref <150)

## 2023-04-24 LAB — HEMOGLOBIN A1C
Hgb A1c MFr Bld: 5.2 %{Hb} (ref <5.7)
Mean Plasma Glucose: 103 mg/dL
eAG (mmol/L): 5.7 mmol/L

## 2024-02-21 ENCOUNTER — Telehealth: Admitting: Family Medicine

## 2024-02-21 DIAGNOSIS — M542 Cervicalgia: Secondary | ICD-10-CM

## 2024-02-21 MED ORDER — NAPROXEN 500 MG PO TABS: 500.0000 mg | ORAL_TABLET | Freq: Two times a day (BID) | ORAL | 0 refills | Status: AC

## 2024-02-21 MED ORDER — CYCLOBENZAPRINE HCL 10 MG PO TABS
10.0000 mg | ORAL_TABLET | Freq: Three times a day (TID) | ORAL | 0 refills | Status: AC | PRN
Start: 2024-02-21 — End: ?
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
## 2024-02-21 NOTE — Patient Instructions (Signed)
 Cervical Sprain A cervical sprain is a stretch or tear in one or more of the ligaments in the neck. Ligaments are the tissues that connect bones to each other. Cervical sprains can range from mild to severe. Severe cervical sprains can cause the spinal bones (vertebrae) in the neck to be unstable. This can result in spinal cord damage and serious nervous system problems. Healing time for a cervical sprain depends on the cause and extent of the injury. Most cervical sprains heal in 4-6 weeks. What are the causes? Cervical sprains may be caused by trauma, such as an injury from a motor vehicle accident, a fall, or a sudden forward and backward whipping movement of the head and neck (whiplash injury). Mild cervical sprains may be caused by wear and tear over time. What increases the risk? You are more likely to get a cervical sprain if: You take part in activities that have a high risk of trauma to the neck. These include contact sports, gymnastics, and diving. You have: Osteoarthritis of the spine. Poor strength and flexibility of the neck. Poor posture. You have had a neck injury in the past. You spend long periods in positions that put stress on the neck, such as sitting at a computer. What are the signs or symptoms? Symptoms of this condition include: Any of these problems in the neck, shoulders, or upper back: Pain or tenderness. Stiffness. Swelling. A burning feeling. Sudden tightening of neck muscles (spasms). Limited ability to move the neck. Headache. Dizziness. Nausea or vomiting. Weakness, numbness, or tingling in a hand or an arm. Symptoms may develop right away after injury or may develop over a few days. In some cases, symptoms may go away with treatment and return (recur) over time. How is this diagnosed? This condition may be diagnosed based on: Your symptoms, medical history, and a physical exam. Any recent injuries or known neck problems that you have, such as arthritis  in the neck. Imaging tests, such as X-rays, an MRI, or a CT scan. How is this treated? This condition is treated by resting and icing the injured area and doing physical therapy exercises to improve movement and strength. Heat therapy may be used 2-3 days after the injury if there is no swelling. Depending on the severity of your condition, treatment may also include: Keeping your neck in place (immobilized) for periods of time. This may be done using: A cervical collar. This supports your chin and the back of your head. A cervical traction device. This is a sling that holds up your head. It removes weight and pressure from your neck. Medicines for pain or other symptoms. Surgery. This is rare. Follow these instructions at home: Medicines Take over-the-counter and prescription medicines only as told by your health care provider. Ask your provider if the medicine prescribed to you: Requires you to avoid driving or using machinery. Can cause constipation. You may need to take these actions to prevent or treat constipation: Drink enough fluid to keep your pee pale yellow. Take over-the-counter or prescription medicines. Eat foods that are high in fiber, such as beans, whole grains, and fresh fruits and vegetables. Limit foods that are high in fat and processed sugars, such as fried or sweet foods. If you have a cervical collar: Wear the collar as told by your provider. Do not remove it unless told. Ask before making any adjustments to your collar. If you have long hair, keep it outside of the collar. If you are allowed to remove the  collar for cleaning and bathing: Follow instructions about how to remove it safely. Clean it by hand with mild soap and water and air-dry it completely. If your collar has removable pads, remove them every 1-2 days and wash them by hand with soap and water. Let them air-dry completely before putting them back in the collar. Tell your provider if your skin under  the collar has irritation or sores. Managing pain, stiffness, and swelling     Use a cervical traction device as told. If told, put ice on the affected area. Put ice in a plastic bag. Place a towel between your skin and the bag. Leave the ice on for 20 minutes, 2-3 times a day. If told, apply heat to the affected area before you exercise or as often as told by your provider. Use the heat source that your provider recommends, such as a moist heat pack or a heating pad. Place a towel between your skin and the heat source. Leave the heat on for 20-30 minutes. If your skin turns bright red, remove the ice or heat right away to prevent skin damage. The risk of damage is higher if you cannot feel pain, heat, or cold. Activity Do not drive while wearing a cervical collar. If you do not have a cervical collar, ask if it is safe to drive while your neck heals. Do not lift anything that is heavier than 10 lb (4.5 kg) until your provider says that it is safe. Rest as told by your provider. Avoid positions and activities that make your symptoms worse. Do physical therapy exercises as told by your provider or physical therapist. Return to your normal activities as told by your provider. Ask your provider what activities are safe for you. General instructions Do not use any products that contain nicotine or tobacco. These products include cigarettes, chewing tobacco, and vaping devices, such as e-cigarettes. These can delay healing. If you need help quitting, ask your provider. Keep all follow-up visits. Your provider will monitor your injury and activity level. How is this prevented? To prevent a cervical sprain from happening again: Use and maintain good posture. Make any needed adjustments to your workstation to help you do this. Exercise regularly as told by your provider or physical therapist. Avoid risky activities that may cause a cervical sprain. Contact a health care provider if: You have  symptoms that get worse or do not get better after 2 weeks of treatment. You have new symptoms. Your pain gets worse or does not get better with medicine. You have sores or irritated skin on your neck from wearing your cervical collar. Get help right away if: You have severe pain. You develop numbness, tingling, or weakness in any part of your body. You cannot move a part of your body (you have paralysis). You have neck pain along with severe dizziness or headache. This information is not intended to replace advice given to you by your health care provider. Make sure you discuss any questions you have with your health care provider. Document Revised: 02/28/2022 Document Reviewed: 02/28/2022 Elsevier Patient Education  2024 [employer].
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
## 2024-03-14 ENCOUNTER — Ambulatory Visit: Admitting: Certified Nurse Midwife

## 2024-03-14 DIAGNOSIS — Z01419 Encounter for gynecological examination (general) (routine) without abnormal findings: Secondary | ICD-10-CM

## 2024-03-14 DIAGNOSIS — Z13220 Encounter for screening for lipoid disorders: Secondary | ICD-10-CM

## 2024-03-14 DIAGNOSIS — Z114 Encounter for screening for human immunodeficiency virus [HIV]: Secondary | ICD-10-CM

## 2024-03-14 DIAGNOSIS — Z1231 Encounter for screening mammogram for malignant neoplasm of breast: Secondary | ICD-10-CM

## 2024-03-14 DIAGNOSIS — Z131 Encounter for screening for diabetes mellitus: Secondary | ICD-10-CM

## 2024-03-14 DIAGNOSIS — Z1159 Encounter for screening for other viral diseases: Secondary | ICD-10-CM

## 2024-03-14 DIAGNOSIS — Z124 Encounter for screening for malignant neoplasm of cervix: Secondary | ICD-10-CM

## 2024-03-16 ENCOUNTER — Ambulatory Visit: Admitting: Family Medicine

## 2024-04-25 ENCOUNTER — Encounter: Payer: Self-pay | Admitting: Internal Medicine

## 2024-04-25 NOTE — Progress Notes (Deleted)
 Subjective:    Patient ID: Tammy Bennett, female    DOB: 12-29-1980, 43 y.o.   MRN: [id number]  HPI  Patient presents to clinic today for her annual exam.  Flu: Tetanus: COVID: Pap smear: 12/2018 Mammogram: Vision screening: Dentist:  Diet: Exercise:  Review of Systems     Past Medical History:  Diagnosis Date   Allergy    Anemia    DURING PREGNANCY   Anxiety    Depression    Gastritis    GERD (gastroesophageal reflux disease)    IBS (irritable bowel syndrome)    Murmur    as a child    SVT (supraventricular tachycardia) (HCC)    Tendonitis    ARM RIGHT    Current Outpatient Medications  Medication Sig Dispense Refill   albuterol  (VENTOLIN  HFA) 108 (90 Base) MCG/ACT inhaler Inhale 2 puffs into the lungs every 4 (four) hours as needed for wheezing or shortness of breath. 1 each 0   busPIRone  (BUSPAR ) 5 MG tablet Take 1 tablet (5 mg total) by mouth 3 (three) times daily as needed (anxiety). 90 tablet 3   cyclobenzaprine  (FLEXERIL ) 10 MG tablet Take 1 tablet (10 mg total) by mouth 3 (three) times daily as needed for muscle spasms. 30 tablet 0   hydrOXYzine  (ATARAX ) 25 MG tablet Take 1 tablet (25 mg total) by mouth 2 (two) times daily as needed for anxiety. May take 1 additional pill per dose if acute panic attack. Max 3 pills in 24 hours 30 tablet 2   levonorgestrel (MIRENA) 20 MCG/24HR IUD 1 each by Intrauterine route once.     mirtazapine  (REMERON ) 7.5 MG tablet TAKE 1 TABLET(7.5 MG) BY MOUTH AT BEDTIME 90 tablet 3   PARoxetine  (PAXIL ) 10 MG tablet Take 1 tablet (10 mg total) by mouth daily. 90 tablet 3   No current facility-administered medications for this visit.    Allergies  Allergen Reactions   Bacitracin  Rash    Family History  Problem Relation Age of Onset   Stroke Mother    Stomach cancer Maternal Grandmother    Leukemia Maternal Grandfather    Pancreatic cancer Paternal Grandmother    Healthy Brother    Sickle cell anemia Half-Sister     Crohn's disease Half-Sister    Ulcerative colitis Half-Sister    Thyroid  disease Cousin    Breast cancer Maternal Aunt 17    Social History   Socioeconomic History   Marital status: Married    Spouse name: Not on file   Number of children: Not on file   Years of education: Not on file   Highest education level: 12th grade  Occupational History   Not on file  Tobacco Use   Smoking status: Never   Smokeless tobacco: Never  Vaping Use   Vaping status: Never Used  Substance and Sexual Activity   Alcohol use: Yes    Comment: OCC   Drug use: Not Currently    Types: Marijuana    Comment: none in years   Sexual activity: Yes    Birth control/protection: Surgical, I.U.D.    Comment: Tubal/ IUD  Other Topics Concern   Not on file  Social History Narrative   ** Merged History Encounter **       Social Drivers of Health   Financial Resource Strain: High Risk (04/23/2023)   Overall Financial Resource Strain (CARDIA)    Difficulty of Paying Living Expenses: Hard  Food Insecurity: Food Insecurity Present (04/23/2023)   Hunger Vital Sign  Worried About Programme researcher, broadcasting/film/video in the Last Year: Often true    Ran Out of Food in the Last Year: Sometimes true  Transportation Needs: Unmet Transportation Needs (04/23/2023)   PRAPARE - Administrator, Civil Service (Medical): Yes    Lack of Transportation (Non-Medical): No  Physical Activity: Insufficiently Active (04/23/2023)   Exercise Vital Sign    Days of Exercise per Week: 1 day    Minutes of Exercise per Session: 60 min  Stress: Stress Concern Present (04/23/2023)   [employer] of Occupational Health - Occupational Stress Questionnaire    Feeling of Stress : To some extent  Social Connections: Moderately Integrated (04/23/2023)   Social Connection and Isolation Panel    Frequency of Communication with Friends and Family: More than three times a week    Frequency of Social Gatherings with Friends and Family: Never     Attends Religious Services: More than 4 times per year    Active Member of [employer] or Organizations: No    Attends Engineer, structural: Not on file    Marital Status: Married  Catering manager Violence: Not on file     Constitutional: Denies fever, malaise, fatigue, headache or abrupt weight changes.  HEENT: Denies eye pain, eye redness, ear pain, ringing in the ears, wax buildup, runny nose, nasal congestion, bloody nose, or sore throat. Respiratory: Denies difficulty breathing, shortness of breath, cough or sputum production.   Cardiovascular: Patient reports swelling in legs.  Denies chest pain, chest tightness, palpitations or swelling in the hands.  Gastrointestinal: Pt reports intermittent dysphagia, reflux. Denies abdominal pain, bloating, constipation, diarrhea or blood in the stool.  GU: Denies urgency, frequency, pain with urination, burning sensation, blood in urine, odor or discharge. Musculoskeletal: Denies decrease in range of motion, difficulty with gait, muscle pain or joint pain and swelling.  Skin: Denies redness, rashes, lesions or ulcercations.  Neurological: Pt reports insomnia. Denies dizziness, difficulty with memory, difficulty with speech or problems with balance and coordination.  Psych: Pt has a history of anxiety. Denies depression, SI/HI.  No other specific complaints in a complete review of systems (except as listed in HPI above).  Objective:   Physical Exam   There were no vitals taken for this visit.  Wt Readings from Last 3 Encounters:  04/23/23 184 lb (83.5 kg)  02/19/22 181 lb (82.1 kg)  12/30/21 174 lb 9.7 oz (79.2 kg)    General: Appears her stated age, obese, in NAD. Skin: Warm, dry and intact.  HEENT: Head: normal shape and size; Eyes: sclera white, no icterus, conjunctiva pink, PERRLA and EOMs intact;  Neck:  Neck supple, trachea midline. No masses, lumps or thyromegaly present.  Cardiovascular: Normal rate and rhythm. S1,S2 noted.   No murmur, rubs or gallops noted. No JVD.  Trace nonpitting BLE edema.  Pulmonary/Chest: Normal effort and positive vesicular breath sounds. No respiratory distress. No wheezes, rales or ronchi noted.  Abdomen: Soft and nontender. Normal bowel sounds.  Musculoskeletal: Strength 5/5 BUE/BLE. No difficulty with gait.  Neurological: Alert and oriented. Cranial nerves II-XII grossly intact. Coordination normal.  Psychiatric: Mood and affect normal. Behavior is normal. Judgment and thought content normal.   BMET    Component Value Date/Time   NA 138 04/23/2023 0903   K 4.7 04/23/2023 0903   CL 107 04/23/2023 0903   CO2 25 04/23/2023 0903   GLUCOSE 79 04/23/2023 0903   BUN 14 04/23/2023 0903   CREATININE 0.78 04/23/2023 0903  CALCIUM 9.4 04/23/2023 0903   GFRNONAA 111 08/17/2020 1139   GFRAA 129 08/17/2020 1139    Lipid Panel     Component Value Date/Time   CHOL 149 04/23/2023 0903   TRIG 33 04/23/2023 0903   HDL 59 04/23/2023 0903   CHOLHDL 2.5 04/23/2023 0903   LDLCALC 81 04/23/2023 0903    CBC    Component Value Date/Time   WBC 8.4 04/23/2023 0903   RBC 4.13 04/23/2023 0903   HGB 12.3 04/23/2023 0903   HGB 12.7 01/31/2021 1417   HCT 37.5 04/23/2023 0903   HCT 37.6 01/31/2021 1417   PLT 309 04/23/2023 0903   PLT 351 01/31/2021 1417   MCV 90.8 04/23/2023 0903   MCV 90 01/31/2021 1417   MCH 29.8 04/23/2023 0903   MCHC 32.8 04/23/2023 0903   RDW 12.5 04/23/2023 0903   RDW 12.0 01/31/2021 1417   LYMPHSABS 1,695 08/17/2020 1139   MONOABS 0.4 03/29/2019 0857   EOSABS 23 08/17/2020 1139   BASOSABS 30 08/17/2020 1139    Hgb A1C Lab Results  Component Value Date   HGBA1C 5.2 04/23/2023           Assessment & Plan:   Preventative health maintenance:  Flu shot Tetanus Encouraged her to get her COVID booster Pap smear Mammogram previously ordered-she needs to call and schedule this Encouraged her to consume a balanced diet and exercise regimen Advised her  to see an eye doctor and dentist annually We will check CBC, c-Met, lipid, A1c, HIV and hep C today             RTC in 6 months, follow-up chronic conditions Angeline Laura, NP
